# Patient Record
Sex: Female | Born: 1975 | Race: Black or African American | Hispanic: No | Marital: Single | Smoking: Never smoker
Health system: Southern US, Community
[De-identification: ages and names within clinical notes are randomized; demographics above are authoritative.]

## PROBLEM LIST (undated history)

## (undated) DIAGNOSIS — N19 Unspecified kidney failure: Secondary | ICD-10-CM

## (undated) DIAGNOSIS — N049 Nephrotic syndrome with unspecified morphologic changes: Secondary | ICD-10-CM

## (undated) DIAGNOSIS — R569 Unspecified convulsions: Secondary | ICD-10-CM

## (undated) DIAGNOSIS — Z9289 Personal history of other medical treatment: Secondary | ICD-10-CM

## (undated) DIAGNOSIS — D649 Anemia, unspecified: Secondary | ICD-10-CM

## (undated) DIAGNOSIS — E118 Type 2 diabetes mellitus with unspecified complications: Secondary | ICD-10-CM

## (undated) DIAGNOSIS — E1165 Type 2 diabetes mellitus with hyperglycemia: Secondary | ICD-10-CM

## (undated) DIAGNOSIS — I10 Essential (primary) hypertension: Secondary | ICD-10-CM

## (undated) HISTORY — PX: TUBAL LIGATION: SHX77

## (undated) HISTORY — PX: TONSILLECTOMY: SUR1361

---

## 1997-07-24 DIAGNOSIS — IMO0002 Reserved for concepts with insufficient information to code with codable children: Secondary | ICD-10-CM

## 1997-07-24 HISTORY — DX: Reserved for concepts with insufficient information to code with codable children: IMO0002

## 2000-11-11 ENCOUNTER — Encounter: Payer: Self-pay | Admitting: Emergency Medicine

## 2000-11-12 ENCOUNTER — Observation Stay (HOSPITAL_COMMUNITY): Admission: EM | Admit: 2000-11-12 | Discharge: 2000-11-12 | Payer: Self-pay | Admitting: Emergency Medicine

## 2000-11-14 ENCOUNTER — Encounter: Admission: RE | Admit: 2000-11-14 | Discharge: 2000-11-14 | Payer: Self-pay | Admitting: Family Medicine

## 2001-04-05 ENCOUNTER — Emergency Department (HOSPITAL_COMMUNITY): Admission: EM | Admit: 2001-04-05 | Discharge: 2001-04-05 | Payer: Self-pay | Admitting: Emergency Medicine

## 2001-06-12 ENCOUNTER — Emergency Department (HOSPITAL_COMMUNITY): Admission: EM | Admit: 2001-06-12 | Discharge: 2001-06-12 | Payer: Self-pay

## 2012-05-24 DIAGNOSIS — N049 Nephrotic syndrome with unspecified morphologic changes: Secondary | ICD-10-CM

## 2012-05-24 DIAGNOSIS — D649 Anemia, unspecified: Secondary | ICD-10-CM

## 2012-05-24 HISTORY — DX: Anemia, unspecified: D64.9

## 2012-05-24 HISTORY — DX: Nephrotic syndrome with unspecified morphologic changes: N04.9

## 2012-05-30 DIAGNOSIS — Z9289 Personal history of other medical treatment: Secondary | ICD-10-CM

## 2012-05-30 HISTORY — DX: Personal history of other medical treatment: Z92.89

## 2012-09-28 ENCOUNTER — Emergency Department (HOSPITAL_COMMUNITY): Payer: Medicaid Other

## 2012-09-28 ENCOUNTER — Inpatient Hospital Stay (HOSPITAL_COMMUNITY)
Admission: EM | Admit: 2012-09-28 | Discharge: 2012-10-02 | DRG: 698 | Disposition: A | Discharge status: Home / Self Care | Payer: Medicaid Other | Attending: Internal Medicine | Admitting: Internal Medicine

## 2012-09-28 ENCOUNTER — Encounter (HOSPITAL_COMMUNITY): Payer: Self-pay | Admitting: Family Medicine

## 2012-09-28 DIAGNOSIS — N184 Chronic kidney disease, stage 4 (severe): Secondary | ICD-10-CM | POA: Diagnosis present

## 2012-09-28 DIAGNOSIS — T502X5A Adverse effect of carbonic-anhydrase inhibitors, benzothiadiazides and other diuretics, initial encounter: Secondary | ICD-10-CM | POA: Diagnosis not present

## 2012-09-28 DIAGNOSIS — I5033 Acute on chronic diastolic (congestive) heart failure: Secondary | ICD-10-CM | POA: Diagnosis present

## 2012-09-28 DIAGNOSIS — I2699 Other pulmonary embolism without acute cor pulmonale: Secondary | ICD-10-CM | POA: Diagnosis present

## 2012-09-28 DIAGNOSIS — Z6841 Body Mass Index (BMI) 40.0 and over, adult: Secondary | ICD-10-CM

## 2012-09-28 DIAGNOSIS — F411 Generalized anxiety disorder: Secondary | ICD-10-CM | POA: Diagnosis present

## 2012-09-28 DIAGNOSIS — R071 Chest pain on breathing: Secondary | ICD-10-CM | POA: Diagnosis present

## 2012-09-28 DIAGNOSIS — R51 Headache: Secondary | ICD-10-CM | POA: Diagnosis not present

## 2012-09-28 DIAGNOSIS — F410 Panic disorder [episodic paroxysmal anxiety] without agoraphobia: Secondary | ICD-10-CM | POA: Diagnosis present

## 2012-09-28 DIAGNOSIS — Z91199 Patient's noncompliance with other medical treatment and regimen due to unspecified reason: Secondary | ICD-10-CM

## 2012-09-28 DIAGNOSIS — N049 Nephrotic syndrome with unspecified morphologic changes: Secondary | ICD-10-CM | POA: Diagnosis present

## 2012-09-28 DIAGNOSIS — Z794 Long term (current) use of insulin: Secondary | ICD-10-CM

## 2012-09-28 DIAGNOSIS — J189 Pneumonia, unspecified organism: Secondary | ICD-10-CM | POA: Diagnosis present

## 2012-09-28 DIAGNOSIS — E119 Type 2 diabetes mellitus without complications: Secondary | ICD-10-CM | POA: Diagnosis present

## 2012-09-28 DIAGNOSIS — D72829 Elevated white blood cell count, unspecified: Secondary | ICD-10-CM | POA: Diagnosis present

## 2012-09-28 DIAGNOSIS — R0602 Shortness of breath: Secondary | ICD-10-CM | POA: Diagnosis present

## 2012-09-28 DIAGNOSIS — I319 Disease of pericardium, unspecified: Secondary | ICD-10-CM | POA: Diagnosis present

## 2012-09-28 DIAGNOSIS — Z9089 Acquired absence of other organs: Secondary | ICD-10-CM

## 2012-09-28 DIAGNOSIS — D509 Iron deficiency anemia, unspecified: Secondary | ICD-10-CM | POA: Diagnosis present

## 2012-09-28 DIAGNOSIS — I129 Hypertensive chronic kidney disease with stage 1 through stage 4 chronic kidney disease, or unspecified chronic kidney disease: Secondary | ICD-10-CM | POA: Diagnosis present

## 2012-09-28 DIAGNOSIS — E876 Hypokalemia: Secondary | ICD-10-CM | POA: Diagnosis not present

## 2012-09-28 DIAGNOSIS — N179 Acute kidney failure, unspecified: Secondary | ICD-10-CM | POA: Diagnosis present

## 2012-09-28 DIAGNOSIS — Z79899 Other long term (current) drug therapy: Secondary | ICD-10-CM

## 2012-09-28 DIAGNOSIS — I498 Other specified cardiac arrhythmias: Secondary | ICD-10-CM | POA: Diagnosis present

## 2012-09-28 DIAGNOSIS — I509 Heart failure, unspecified: Secondary | ICD-10-CM | POA: Diagnosis present

## 2012-09-28 DIAGNOSIS — I10 Essential (primary) hypertension: Secondary | ICD-10-CM | POA: Diagnosis present

## 2012-09-28 DIAGNOSIS — E1165 Type 2 diabetes mellitus with hyperglycemia: Principal | ICD-10-CM | POA: Diagnosis present

## 2012-09-28 HISTORY — DX: Type 2 diabetes mellitus with unspecified complications: E11.8

## 2012-09-28 HISTORY — DX: Unspecified kidney failure: N19

## 2012-09-28 HISTORY — DX: Personal history of other medical treatment: Z92.89

## 2012-09-28 HISTORY — DX: Anemia, unspecified: D64.9

## 2012-09-28 HISTORY — DX: Type 2 diabetes mellitus with hyperglycemia: E11.65

## 2012-09-28 HISTORY — DX: Essential (primary) hypertension: I10

## 2012-09-28 HISTORY — DX: Nephrotic syndrome with unspecified morphologic changes: N04.9

## 2012-09-28 LAB — CBC WITH DIFFERENTIAL/PLATELET
Basophils Absolute: 0.1 10*3/uL (ref 0.0–0.1)
Basophils Relative: 0 % (ref 0–1)
Eosinophils Absolute: 0.9 10*3/uL — ABNORMAL HIGH (ref 0.0–0.7)
Eosinophils Relative: 5 % (ref 0–5)
HCT: 30.8 % — ABNORMAL LOW (ref 36.0–46.0)
Hemoglobin: 10.6 g/dL — ABNORMAL LOW (ref 12.0–15.0)
Lymphocytes Relative: 8 % — ABNORMAL LOW (ref 12–46)
Lymphs Abs: 1.3 10*3/uL (ref 0.7–4.0)
MCH: 28 pg (ref 26.0–34.0)
MCHC: 34.4 g/dL (ref 30.0–36.0)
MCV: 81.5 fL (ref 78.0–100.0)
Monocytes Absolute: 1.2 10*3/uL — ABNORMAL HIGH (ref 0.1–1.0)
Monocytes Relative: 7 % (ref 3–12)
Neutro Abs: 12.8 10*3/uL — ABNORMAL HIGH (ref 1.7–7.7)
Neutrophils Relative %: 79 % — ABNORMAL HIGH (ref 43–77)
Platelets: 433 10*3/uL — ABNORMAL HIGH (ref 150–400)
RBC: 3.78 MIL/uL — ABNORMAL LOW (ref 3.87–5.11)
RDW: 13.3 % (ref 11.5–15.5)
WBC: 16.2 10*3/uL — ABNORMAL HIGH (ref 4.0–10.5)

## 2012-09-28 LAB — COMPREHENSIVE METABOLIC PANEL
ALT: 19 U/L (ref 0–35)
AST: 21 U/L (ref 0–37)
Alkaline Phosphatase: 100 U/L (ref 39–117)
CO2: 18 mEq/L — ABNORMAL LOW (ref 19–32)
Calcium: 8.5 mg/dL (ref 8.4–10.5)
GFR calc non Af Amer: 36 mL/min — ABNORMAL LOW (ref >90)
Potassium: 3.8 mEq/L (ref 3.5–5.1)
Sodium: 136 mEq/L (ref 135–145)
Total Protein: 5.9 g/dL — ABNORMAL LOW (ref 6.0–8.3)

## 2012-09-28 LAB — POCT I-STAT 3, ART BLOOD GAS (G3+)
Patient temperature: 98.6
pCO2 arterial: 29.5 mmHg — ABNORMAL LOW (ref 35.0–45.0)
pH, Arterial: 7.415 (ref 7.350–7.450)

## 2012-09-28 LAB — BLOOD GAS, ARTERIAL
Acid-base deficit: 5.2 mmol/L — ABNORMAL HIGH (ref 0.0–2.0)
FIO2: 1 %
O2 Saturation: 99.3 %
Patient temperature: 98.6
TCO2: 19.2 mmol/L (ref 0–100)

## 2012-09-28 LAB — POCT I-STAT, CHEM 8
BUN: 25 mg/dL — ABNORMAL HIGH (ref 6–23)
Chloride: 112 mEq/L (ref 96–112)
Creatinine, Ser: 1.7 mg/dL — ABNORMAL HIGH (ref 0.50–1.10)
Glucose, Bld: 287 mg/dL — ABNORMAL HIGH (ref 70–99)
Hemoglobin: 9.5 g/dL — ABNORMAL LOW (ref 12.0–15.0)
Potassium: 4.2 mEq/L (ref 3.5–5.1)
Sodium: 139 mEq/L (ref 135–145)

## 2012-09-28 LAB — MAGNESIUM: Magnesium: 2 mg/dL (ref 1.5–2.5)

## 2012-09-28 LAB — STREP PNEUMONIAE URINARY ANTIGEN: Strep Pneumo Urinary Antigen: NEGATIVE

## 2012-09-28 LAB — RAPID URINE DRUG SCREEN, HOSP PERFORMED
Amphetamines: NOT DETECTED
Opiates: NOT DETECTED

## 2012-09-28 LAB — GLUCOSE, CAPILLARY: Glucose-Capillary: 283 mg/dL — ABNORMAL HIGH (ref 70–99)

## 2012-09-28 LAB — POCT I-STAT TROPONIN I

## 2012-09-28 LAB — PHOSPHORUS: Phosphorus: 3.7 mg/dL (ref 2.3–4.6)

## 2012-09-28 MED ORDER — INSULIN ASPART 100 UNIT/ML ~~LOC~~ SOLN
0.0000 [IU] | Freq: Three times a day (TID) | SUBCUTANEOUS | Status: DC
  Administered 2012-09-30 – 2012-10-02 (×3): 3 [IU] via SUBCUTANEOUS

## 2012-09-28 MED ORDER — LORAZEPAM 2 MG/ML IJ SOLN
0.5000 mg | Freq: Once | INTRAMUSCULAR | Status: AC
  Administered 2012-09-28: 0.5 mg via INTRAVENOUS
  Filled 2012-09-28: qty 1

## 2012-09-28 MED ORDER — LABETALOL HCL 5 MG/ML IV SOLN
5.0000 mg | Freq: Once | INTRAVENOUS | Status: AC
  Administered 2012-09-28: 5 mg via INTRAVENOUS
  Filled 2012-09-28: qty 4

## 2012-09-28 MED ORDER — AZITHROMYCIN 250 MG PO TABS
500.0000 mg | ORAL_TABLET | Freq: Once | ORAL | Status: AC
  Administered 2012-09-28: 500 mg via ORAL
  Filled 2012-09-28: qty 2

## 2012-09-28 MED ORDER — HEPARIN BOLUS VIA INFUSION
5000.0000 [IU] | Freq: Once | INTRAVENOUS | Status: AC
  Administered 2012-09-28: 5000 [IU] via INTRAVENOUS
  Filled 2012-09-28: qty 5000

## 2012-09-28 MED ORDER — ISOSORBIDE MONONITRATE ER 30 MG PO TB24
30.0000 mg | ORAL_TABLET | Freq: Every day | ORAL | Status: DC
  Administered 2012-09-28 – 2012-10-02 (×5): 30 mg via ORAL
  Filled 2012-09-28 (×6): qty 1

## 2012-09-28 MED ORDER — DOXYCYCLINE HYCLATE 100 MG PO TABS
100.0000 mg | ORAL_TABLET | Freq: Two times a day (BID) | ORAL | Status: DC
  Filled 2012-09-28: qty 1

## 2012-09-28 MED ORDER — SODIUM CHLORIDE 0.9 % IV SOLN: 250.0000 mL | INTRAVENOUS | Status: DC | PRN

## 2012-09-28 MED ORDER — DEXTROSE 5 % IV SOLN
1.0000 g | INTRAVENOUS | Status: DC
  Administered 2012-09-29 – 2012-09-30 (×2): 1 g via INTRAVENOUS
  Filled 2012-09-28 (×2): qty 10

## 2012-09-28 MED ORDER — FUROSEMIDE 10 MG/ML IJ SOLN
20.0000 mg | Freq: Once | INTRAMUSCULAR | Status: AC
  Administered 2012-09-28: 20 mg via INTRAVENOUS
  Filled 2012-09-28: qty 2

## 2012-09-28 MED ORDER — HEPARIN (PORCINE) IN NACL 100-0.45 UNIT/ML-% IJ SOLN
1450.0000 [IU]/h | INTRAMUSCULAR | Status: DC
  Administered 2012-09-28 – 2012-09-29 (×2): 1450 [IU]/h via INTRAVENOUS
  Filled 2012-09-28 (×3): qty 250

## 2012-09-28 MED ORDER — FUROSEMIDE 10 MG/ML IJ SOLN
40.0000 mg | Freq: Once | INTRAMUSCULAR | Status: AC
  Administered 2012-09-28: 40 mg via INTRAVENOUS
  Filled 2012-09-28: qty 4

## 2012-09-28 MED ORDER — FUROSEMIDE 10 MG/ML IJ SOLN
40.0000 mg | Freq: Two times a day (BID) | INTRAMUSCULAR | Status: DC
  Administered 2012-09-28 – 2012-09-29 (×2): 40 mg via INTRAVENOUS
  Filled 2012-09-28 (×4): qty 4

## 2012-09-28 MED ORDER — HEPARIN SODIUM (PORCINE) 5000 UNIT/ML IJ SOLN
5000.0000 [IU] | Freq: Three times a day (TID) | INTRAMUSCULAR | Status: DC
  Filled 2012-09-28 (×2): qty 1

## 2012-09-28 MED ORDER — SODIUM CHLORIDE 0.9 % IJ SOLN
3.0000 mL | Freq: Two times a day (BID) | INTRAMUSCULAR | Status: DC
  Administered 2012-09-29 – 2012-10-02 (×7): 3 mL via INTRAVENOUS

## 2012-09-28 MED ORDER — DEXTROSE 5 % IV SOLN
1.0000 g | Freq: Once | INTRAVENOUS | Status: AC
  Administered 2012-09-28: 1 g via INTRAVENOUS
  Filled 2012-09-28: qty 10

## 2012-09-28 MED ORDER — SODIUM CHLORIDE 0.9 % IJ SOLN: 3.0000 mL | INTRAMUSCULAR | Status: DC | PRN

## 2012-09-28 MED ORDER — ALBUTEROL SULFATE (5 MG/ML) 0.5% IN NEBU
5.0000 mg | INHALATION_SOLUTION | Freq: Once | RESPIRATORY_TRACT | Status: AC
  Administered 2012-09-28: 5 mg via RESPIRATORY_TRACT
  Filled 2012-09-28: qty 1

## 2012-09-28 MED ORDER — AZITHROMYCIN 250 MG PO TABS
250.0000 mg | ORAL_TABLET | Freq: Every day | ORAL | Status: DC
  Administered 2012-09-29 – 2012-09-30 (×2): 250 mg via ORAL
  Filled 2012-09-28 (×2): qty 1

## 2012-09-28 MED ORDER — LEVALBUTEROL HCL 0.63 MG/3ML IN NEBU
0.6300 mg | INHALATION_SOLUTION | Freq: Four times a day (QID) | RESPIRATORY_TRACT | Status: DC | PRN
  Filled 2012-09-28: qty 3

## 2012-09-28 MED ORDER — AMLODIPINE BESYLATE 5 MG PO TABS
5.0000 mg | ORAL_TABLET | Freq: Every day | ORAL | Status: DC
  Administered 2012-09-28: 5 mg via ORAL
  Filled 2012-09-28 (×2): qty 1

## 2012-09-28 MED ORDER — METOLAZONE 2.5 MG PO TABS
2.5000 mg | ORAL_TABLET | Freq: Every day | ORAL | Status: DC
  Administered 2012-09-28 – 2012-09-29 (×2): 2.5 mg via ORAL
  Filled 2012-09-28 (×2): qty 1

## 2012-09-28 MED ORDER — INSULIN GLARGINE 100 UNIT/ML ~~LOC~~ SOLN
30.0000 [IU] | Freq: Every day | SUBCUTANEOUS | Status: DC
  Administered 2012-09-28 – 2012-10-01 (×4): 30 [IU] via SUBCUTANEOUS

## 2012-09-28 MED ORDER — NITROGLYCERIN 0.4 MG SL SUBL
0.4000 mg | SUBLINGUAL_TABLET | Freq: Once | SUBLINGUAL | Status: AC
  Administered 2012-09-28: 0.4 mg via SUBLINGUAL
  Filled 2012-09-28: qty 25

## 2012-09-28 MED ORDER — INSULIN ASPART 100 UNIT/ML ~~LOC~~ SOLN
10.0000 [IU] | Freq: Once | SUBCUTANEOUS | Status: AC
  Administered 2012-09-28: 10 [IU] via SUBCUTANEOUS
  Filled 2012-09-28: qty 1

## 2012-09-28 NOTE — ED Notes (Signed)
Pt c/o "splitting" headache rates pain 8/10.

## 2012-09-28 NOTE — ED Notes (Signed)
Pt presents from home with SOB that began at 0400 today and chest pressure x2 days. Pt was recently diagnosed with CHF and stage 1 renal failure. Pt is also diabetic, anemic, and hypertensive. Per Pt her CBG is typically in the 300's, EMS CBG was 266. Per Pt, her BP is typically 180/90-100. EMS BP of 178/118. Pt SOB on arrival. EMS gave 324 ASA and 1SL nitro PTA with no relief of Chest pressure.

## 2012-09-28 NOTE — ED Provider Notes (Signed)
History     CSN: [id number]  Arrival date & time 09/28/12  4098   First MD Initiated Contact with Patient 09/28/12 [phone]      Chief Complaint  Patient presents with  . Chest Pain  . Shortness of Breath    (Consider location/radiation/quality/duration/timing/severity/associated sxs/prior treatment) Patient is a 37 y.o. female presenting with chest pain and shortness of breath. The history is provided by the patient.  Chest Pain Associated symptoms: cough, fatigue and shortness of breath   Associated symptoms: no abdominal pain, no back pain, no headache, no nausea, no numbness, not vomiting and no weakness   Shortness of Breath Associated symptoms: chest pain and cough   Associated symptoms: no abdominal pain, no headaches, no rash and no vomiting    patient presents with worsening shortness of breath over the last few days. She states she is not able sleep in 5 days because she cannot lay down. She's also had some dull right-sided chest pain. Patient has a history of CHF. She states she feels as if she has fluid or lungs. She also has a history of anemia due to heavy periods. She states that she does not know what her hemoglobin normally runs. She has increased swelling or legs. She has had an occasional cough and throw up once after the cough. She states she's had a little bit of sputum,.  Past Medical History  Diagnosis Date  . CHF (congestive heart failure) Nov 2013  . Renal failure     Stage 1  . Hypertension   . Diabetes mellitus without complication 1999  . Anemia Nov 2013    Past Surgical History  Procedure Laterality Date  . Cesarean section      x2  . Tonsillectomy    . Tubal ligation      Family History  Problem Relation Age of Onset  . Epilepsy Father   . Transient ischemic attack Mother   . Cancer - Other Mother     brain  . Hypertension Brother     History  Substance Use Topics  . Smoking status: Never Smoker   . Smokeless tobacco: Never Used  .  Alcohol Use: No    OB History   Grav Para Term Preterm Abortions TAB SAB Ect Mult Living                  Review of Systems  Constitutional: Positive for fatigue. Negative for activity change and appetite change.  HENT: Negative for neck stiffness.   Eyes: Negative for pain.  Respiratory: Positive for cough and shortness of breath. Negative for chest tightness.   Cardiovascular: Positive for chest pain and leg swelling.  Gastrointestinal: Negative for nausea, vomiting, abdominal pain and diarrhea.  Genitourinary: Negative for flank pain.  Musculoskeletal: Negative for back pain.  Skin: Negative for rash.  Neurological: Negative for weakness, numbness and headaches.  Psychiatric/Behavioral: Negative for behavioral problems.    Allergies  Review of patient's allergies indicates no known allergies.  Home Medications   Current Outpatient Rx  Name  Route  Sig  Dispense  Refill  . amLODipine (NORVASC) 5 MG tablet   Oral   Take 5 mg by mouth daily.         . furosemide (LASIX) 40 MG tablet   Oral   Take 40 mg by mouth daily.         . insulin lispro protamine-insulin lispro (HUMALOG 75/25) (75-25) 100 UNIT/ML SUSP   Subcutaneous   Inject 30 Units  into the skin 2 (two) times daily with a meal.         . isosorbide mononitrate (IMDUR) 30 MG 24 hr tablet   Oral   Take 30 mg by mouth daily.         Marland Kitchen lisinopril (PRINIVIL,ZESTRIL) 10 MG tablet   Oral   Take 10 mg by mouth 2 (two) times daily.         . metolazone (ZAROXOLYN) 2.5 MG tablet   Oral   Take 2.5 mg by mouth daily. Take 30 mins before taking Lasix         . Vitamin D, Ergocalciferol, (DRISDOL) 50000 UNITS CAPS   Oral   Take 50,000 Units by mouth every 7 (seven) days. Takes on Wednesday           BP 170/96  Pulse 94  Temp(Src) 98.4 F (36.9 C)  Resp 20  SpO2 100%  LMP 09/08/2012  Physical Exam  Nursing note and vitals reviewed. Constitutional: She is oriented to person, place, and time.  She appears well-developed and well-nourished.  Patient is obese.  HENT:  Head: Normocephalic and atraumatic.  Eyes: EOM are normal. Pupils are equal, round, and reactive to light.  Neck: Normal range of motion. Neck supple.  Cardiovascular: Normal rate, regular rhythm and normal heart sounds.   No murmur heard. Pulmonary/Chest: Effort normal. No respiratory distress. She has wheezes. She has no rales.  Wheezes at bilateral bases  Abdominal: Soft. Bowel sounds are normal. She exhibits no distension. There is no tenderness. There is no rebound and no guarding.  Musculoskeletal: Normal range of motion.  Moderate pitting edema to bilateral lower legs.  Neurological: She is alert and oriented to person, place, and time. No cranial nerve deficit.  Skin: Skin is warm and dry.  Psychiatric: She has a normal mood and affect. Her speech is normal.    ED Course  Procedures (including critical care time)  Labs Reviewed  CBC WITH DIFFERENTIAL - Abnormal; Notable for the following:    WBC 16.2 (*)    RBC 3.78 (*)    Hemoglobin 10.6 (*)    HCT 30.8 (*)    Platelets 433 (*)    Neutrophils Relative 79 (*)    Neutro Abs 12.8 (*)    Lymphocytes Relative 8 (*)    Monocytes Absolute 1.2 (*)    Eosinophils Absolute 0.9 (*)    All other components within normal limits  PRO B NATRIURETIC PEPTIDE - Abnormal; Notable for the following:    Pro B Natriuretic peptide (BNP) 6281.0 (*)    All other components within normal limits  GLUCOSE, CAPILLARY - Abnormal; Notable for the following:    Glucose-Capillary 283 (*)    All other components within normal limits  POCT I-STAT, CHEM 8 - Abnormal; Notable for the following:    BUN 25 (*)    Creatinine, Ser 1.70 (*)    Glucose, Bld 287 (*)    Hemoglobin 9.5 (*)    HCT 28.0 (*)    All other components within normal limits  URINE RAPID DRUG SCREEN (HOSP PERFORMED)  COMPREHENSIVE METABOLIC PANEL  TROPONIN I  POCT I-STAT TROPONIN I   Dg Chest 2  View  09/28/2012  *RADIOLOGY REPORT*  Clinical Data: Chest pain and shortness of breath.  CHEST - 2 VIEW  Comparison: Chest x-ray 09/28/2012.  Findings: Lung volumes are slightly low.  Extensive bibasilar opacities (left greater than right), favored to reflect sequelae of aspiration or developing bilateral lower  lobe bronchopneumonia. Probable small bilateral pleural effusions.  Pulmonary venous congestion, without frank pulmonary edema.  Heart size is borderline enlarged.  Upper mediastinal contours are slightly distorted by patient rotation to the left.  IMPRESSION: 1.  Extensive bibasilar air space disease concerning for sequelae of recent aspiration or developing bilateral lower lobe bronchopneumonia. 2.  Probable small bilateral pleural effusions (left greater than right).   Original Report Authenticated By: Trudie Reed, M.D.      1. CAP (community acquired pneumonia)   2. CHF (congestive heart failure)   3. Acute on chronic diastolic CHF (congestive heart failure)   4. Acute on chronic renal failure   5. Diabetes mellitus, type 2   6. Hypertension   7. Shortness of breath      Date: 09/28/2012  Rate: 110  Rhythm: sinus tachycardia  QRS Axis: normal  Intervals: normal  ST/T Wave abnormalities: normal  Conduction Disutrbances:none  Narrative Interpretation:   Old EKG Reviewed: none available    MDM  Patient with shortness of breath. Likely mixed picture of CHF and pneumonia. White count is elevated as is BNP. Patient was given Lasix. No nitroglycerin at this time due 2 moderate blood pressure. She was also given antibiotics. Her primary care doctor is in [facility] and she'll be admitted to the internal medicine teaching service         Juliet Rude. Rubin Payor, MD 09/28/12 1515

## 2012-09-28 NOTE — Progress Notes (Addendum)
Internal Medicine Interim Progress Note  Called by RN with patient complaints of persistent worsening chest pressure and headache.  Subjective: Patient reports substernal chest pressure that radiates to the right. She continues to feel anxious and cries during interview. She cannot tell if pain is worse with deep inspiration.  Of note, her soon to be step father and mother are at bedside and report history of frequent panic attacks, during which they have to tell her to calm down and take deep breaths in.  Objective: Filed Vitals:     BP: 204/91  Pulse: 110  Temp:   Resp: 22  Saturating 100% on RA  EKG: tachycardia without significant ST changes  General: resting in bed, anxious appearing HEENT: PERRL, EOMI, no scleral icterus Cardiac: tachycardic, no rubs, murmurs or gallops Pulm: clear to auscultation bilaterally, moving normal volumes of air, denies pain on deep inspiration during exam, chest pain not tender to palpation. Abd: soft, nontender, nondistended, BS present Ext: warm and well perfused, +b/l 2+ pedal edema Neuro: alert and oriented X3, cranial nerves II-XII grossly intact  After patient given labetalol (5mg ) and ativan (0.5mg ) and we helped calm her, her pressure came down to 170/96 and chest pressure resolved.  Assessment/Plan Patient likely having a panic attack which is exacerbating her HR and BP, as she is overwhelmed by her illness.  Less concerning since symptoms resolved, but will still get stat CMET (to monitor Cr for possible CTA) and troponin, and continue with plan to cycle troponins.  Of note, her step father reports that she recently underwent cardiac cath that showed nonobstructive disease. We will get these records.  She was intermediate prob by heart rate for PE, but again, my suspicion of this is less likely given her history of panic attacks and resolution of symptoms.  We will continue to monitor.  ADDENDUM: We received records from [facility]  that suggest patient is without history of CHF, but rather nephrotic syndrome (DM nephrosclerosis).  This changes the clinical picture and patient is at risk for PE as she is hypercoagulable.  Instead of risking renal function deterioration with CTA chest at this point, we will diurese her tonight, monitor AM renal function and initiate empiric heparin gtt (high suspicion given ABGs, tachycardia, chest pressure and tachypnea)

## 2012-09-28 NOTE — Progress Notes (Signed)
ANTICOAGULATION CONSULT NOTE - Initial Consult  Pharmacy Consult for heparin Indication: r/o PE  No Known Allergies  Patient Measurements: Height: 5\' 8"  (172.7 cm) Weight: 287 lb 14.7 oz (130.6 kg) IBW/kg (Calculated) : 63.9 Adjusted  Weight: 90.6 kg  Vital Signs: Temp: 99.4 F (37.4 C) (03/08 1854) Temp src: Oral (03/08 1854) BP: 162/114 mmHg (03/08 1748) Pulse Rate: 106 (03/08 1854)  Labs:  Recent Labs  09/28/12 0719 09/28/12 0735 09/28/12 1457  HGB 10.6* 9.5*  --   HCT 30.8* 28.0*  --   PLT 433*  --   --   CREATININE  --  1.70* 1.77*  TROPONINI  --   --  <0.30    Estimated Creatinine Clearance: 62.8 ml/min (by C-G formula based on Cr of 1.77).   Medical History: Past Medical History  Diagnosis Date  . CHF (congestive heart failure) Nov 2013  . Renal failure     Stage 1  . Hypertension   . Diabetes mellitus without complication 1999  . Anemia Nov 2013  . History of nuclear stress test 05/30/12    Subtle reversible defect in the anterior septum at the junction of the mid ventricle and apex without corresponding wall motion abnormality, EF 60%  . Nephrotic syndrome 05/2012    Advanced diabetic nephrosclerosis; bx at [facility]    Medications:  Prescriptions prior to admission  Medication Sig Dispense Refill  . amLODipine (NORVASC) 5 MG tablet Take 5 mg by mouth daily.      . furosemide (LASIX) 40 MG tablet Take 40 mg by mouth daily.      . insulin lispro protamine-insulin lispro (HUMALOG 75/25) (75-25) 100 UNIT/ML SUSP Inject 30 Units into the skin 2 (two) times daily with a meal.      . isosorbide mononitrate (IMDUR) 30 MG 24 hr tablet Take 30 mg by mouth daily.      Marland Kitchen lisinopril (PRINIVIL,ZESTRIL) 10 MG tablet Take 10 mg by mouth 2 (two) times daily.      . metolazone (ZAROXOLYN) 2.5 MG tablet Take 2.5 mg by mouth daily. Take 30 mins before taking Lasix      . Vitamin D, Ergocalciferol, (DRISDOL) 50000 UNITS CAPS Take 50,000 Units by mouth every  7 (seven) days. Takes on Wednesday        Assessment: Ms. Daily is a 37 yo F to start heparin per pharmacy protocol for r/o PE.  CTA is being postponed 2nd her renal insufficiency.  She has a hx of CHF, T2DM, HTN and stage 1 CKD.   Goal of Therapy:  Heparin level 0.3-0.7 units/ml Monitor platelets by anticoagulation protocol: Yes   Plan:  Give 5000 units bolus x 1 Start heparin infusion at 1450 units/hr Check anti-Xa level in 6 hours and daily while on heparin Continue to monitor H&H and platelets  Herby Abraham, Pharm.D. [phone] 09/28/2012 7:03 PM

## 2012-09-28 NOTE — ED Notes (Signed)
Pt ambulated to restroom. 

## 2012-09-28 NOTE — H&P (Signed)
Internal Medicine Teaching Service Resident Admission Note Date: 09/28/2012  Patient name: Janice Young Medical record number: [id number] Date of birth: [birth year] Age: 38 y.o. Gender: female PCP: No primary provider on file.  Medical Service:  I have reviewed the note by Leontine Locket MS III and was present during the interview and physical exam.  Please see my separate note for further details and clarifications.   Signed: Yanique Mulvihill R 09/28/2012, 3:56 PM

## 2012-09-28 NOTE — Progress Notes (Signed)
Medical Student Progress Note  Subjective: Janice Young was experiencing increased chest pressure with high BP (up to 200 systolic), so we were called to reevaluate her.  The chest pain radiated to the right.  Janice Young had tachypnea, tachycardia and increased SOB.  She because tearful during the examination and explained her fear of dying since her daughter's father died recently from cardiac causes.  Janice Young has a history of panic attacks.    Additionally, Janice Young stated that she was on coreg and her lasix were recently increased to 40mg  BID (not daily, per pharmacy).  Per the patient's family, her cardiologist thought she might have "a heart blockage" but when the tests were done, no blockages were found.  Just prior to evaluation ativan and labetalol were given.  After discussing Janice Young's fears and encouraging her to take deep breaths, Janice Young stated that her chest pain and pressure were better but not gone.  Objective: Vital signs in last 24 hours: Filed Vitals:   09/28/12 1630 09/28/12 1730 09/28/12 1748 09/28/12 1854  BP: 187/116  162/114   Pulse: 106  116 106  Temp:   99.4 F (37.4 C) 99.4 F (37.4 C)  TempSrc:   Oral Oral  Resp:   22 2  Height:   5\' 8"  (1.727 m) 5\' 8"  (1.727 m)  Weight:   130.681 kg (288 lb 1.6 oz) 130.6 kg (287 lb 14.7 oz)  SpO2: 99% 98% 100% 100%   Weight change:   Intake/Output Summary (Last 24 hours) at 09/28/12 1903 Last data filed at 09/28/12 1703  Gross per 24 hour  Intake      0 ml  Output    650 ml  Net   -650 ml   Physical Exam: General: sitting up in bed; anxious and tearful, taking shallow quick breaths HEENT: PERRL, EOMI, no scleral icterus Cardiac: RRR, no rubs, murmurs or gallops Pulm: bilateral crackles and decreased breath sounds Abd: obese, soft, nontender, nondistended, BS present Ext: warm and well perfused, 2+ pedal edema bilaterally; decreased sensation bilaterally Neuro: alert and oriented X3, cranial nerves II-XII grossly  intact  Lab Results: CBC    Component Value Date/Time   WBC 16.2* 09/28/2012 0719   RBC 3.78* 09/28/2012 0719   HGB 9.5* 09/28/2012 0735   HCT 28.0* 09/28/2012 0735   PLT 433* 09/28/2012 0719   MCV 81.5 09/28/2012 0719   MCH 28.0 09/28/2012 0719   MCHC 34.4 09/28/2012 0719   RDW 13.3 09/28/2012 0719   LYMPHSABS 1.3 09/28/2012 0719   MONOABS 1.2* 09/28/2012 0719   EOSABS 0.9* 09/28/2012 0719   BASOSABS 0.1 09/28/2012 0719    CMP     Component Value Date/Time   NA 136 09/28/2012 1457   K 3.8 09/28/2012 1457   CL 106 09/28/2012 1457   CO2 18* 09/28/2012 1457   GLUCOSE 259* 09/28/2012 1457   BUN 23 09/28/2012 1457   CREATININE 1.77* 09/28/2012 1457   CALCIUM 8.5 09/28/2012 1457   PROT 5.9* 09/28/2012 1457   ALBUMIN 1.4* 09/28/2012 1457   AST 21 09/28/2012 1457   ALT 19 09/28/2012 1457   ALKPHOS 100 09/28/2012 1457   BILITOT 0.1* 09/28/2012 1457   GFRNONAA 36* 09/28/2012 1457   GFRAA 42* 09/28/2012 1457   ABG -on RA    Component Value Date/Time   PHART 7.415 09/28/2012 1805   PCO2ART 29.5 09/28/2012 1805   PO2ART 32.0* 09/28/2012 1805   HCO3 18.9* 09/28/2012 1805   TCO2 20 09/28/2012 1805  ACIDBASEDEF 5.0* 09/28/2012 1805   O2SAT 95.0 09/28/2012 1805    ABG - after 100% FiO2    Component Value Date/Time   PHART 7.429 09/28/2012 1805   PCO2ART 28.2* 09/28/2012 1805   PO2ART 300.0* 09/28/2012 1805   HCO3 18.4* 09/28/2012 1805   TCO2 19.2 09/28/2012 1805   ACIDBASEDEF 5.2* 09/28/2012 1805   O2SAT 99.3 09/28/2012 1805    Micro Results: No results found for this or any previous visit (from the past 240 hour(s)). Studies/Results: Dg Chest 2 View  09/28/2012  *RADIOLOGY REPORT*  Clinical Data: Chest pain and shortness of breath.  CHEST - 2 VIEW  Comparison: Chest x-ray 09/28/2012.  Findings: Lung volumes are slightly low.  Extensive bibasilar opacities (left greater than right), favored to reflect sequelae of aspiration or developing bilateral lower lobe bronchopneumonia. Probable small bilateral pleural effusions.  Pulmonary venous congestion,  without frank pulmonary edema.  Heart size is borderline enlarged.  Upper mediastinal contours are slightly distorted by patient rotation to the left.  IMPRESSION: 1.  Extensive bibasilar air space disease concerning for sequelae of recent aspiration or developing bilateral lower lobe bronchopneumonia. 2.  Probable small bilateral pleural effusions (left greater than right).   Original Report Authenticated By: Trudie Reed, M.D.    Medications: I have reviewed the patient's current medications. Scheduled Meds: . amLODipine  5 mg Oral Daily  . [START ON 09/29/2012] azithromycin  250 mg Oral Daily  . [START ON 09/29/2012] cefTRIAXone (ROCEPHIN)  IV  1 g Intravenous Q24H  . furosemide  40 mg Intravenous BID  . heparin  5,000 Units Intravenous Once  . insulin aspart  0-20 Units Subcutaneous TID WC  . insulin glargine  30 Units Subcutaneous QHS  . isosorbide mononitrate  30 mg Oral Daily  . metolazone  2.5 mg Oral Daily  . sodium chloride  3 mL Intravenous Q12H   Continuous Infusions:  PRN Meds:. Assessment/Plan:  Janice Young is a 37yo female with hx CHF, T2DM, and HTN that presents to the ED complaining of SOB for the past 5 days and a 2 day history of chest pressure that is currently stable.   Principal Problem:  Shortness of breath  DDX: CHF exacerbation, CAP, MI, PE  Janice Young endorses an 8lb weight gain over the past 3 days and increasing bilateral LLE and running out of her medications last week. Her proBNP is elevated at 6281 and troponin 0.04. She has been afebrile without sick contacts; however there is leukocytosis. Chest xray showed both pulmonary venous congestion and bilateral lobe bronchopneumonia.  OSH records indicate her hospitalization in Nov 2013 showed an EF of 60-65%.  Her admission to OSH in November 2013 was very similar to this presentation with LE swelling, elevated proBNP, leukocytosis and SOB, but chest xray did not show pulmonary edema. 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 With history of panic attacks, increased chest pressure, tachycardia and tachypnea could be due to anxiety on top of the underlying problem, and patient's HR and BP decreased with ativan and deep breathing.    - telemetry monitoring - check mag & phos given prolonged QT - repeat EKG in AM  CHF workup: - repeat cardiac echo since Nov 2013: EF 60-65% at OSH - continue to follow troponins x 3 (1x neg in ED) - change to IV lasix 40 BID (home dose 40mg  oral BID per patient but 40mg  oral daily per pharmacy).  -continue home isosorbide mononitrate  -  strict I/O  -daily weights  -start labetalol since pt states she was on coreg at home  PE workup: -ABG shows pO2 72.0 and pCO2 29.5; which makes shunt or V/Q mismatch likely -place pt on 100% O2 and repeat ABG to see if pO2 corrects (V/Q mismatch more likely) vs. Not correcting (shunt --> PE). -start heparin subcutaneous since nephrotic syndrome has increased risk of hypercoag and PE is possible   -consider CTA chest tomorrow after rechecking AM creatinine or V/Q scan once consolidation/pulmonary edema resolves - LE venous duplex to r/o DVT  CAP workup: -continue azithromycin and ceftriaxone to cover for bronchopneumonia  -levalbuterol nebulizer q6hrs PRN for SOB   Active Problems:  Acute on chronic renal failure: creatinine 1.7 on admission; 1.45 at OSH (Nov 2013), 0.9 prior to Nov 2013 admission.  OSH records indicate nephrotic syndrome - renal consult - 24 hr urine protein collection    - stop lisinopril due to elevated creatinine  Diabetes mellitus, type 2: per OSH records A1c >13.7 in Nov 2013 - check A1c  - SSI + 30 units lantus qHS  Hypertension: BP elevated on admission (150/103-171/99)  -continue home amlodipine, metalzsone  Anemia: per patient required a blood transfusion in Nov 2013; currently on iron supplement  - recheck CBC in AM to r/o  acute on chronic anemia as cause of SOB    LOS: 0 days   This is a Psychologist, occupational Note.  The care of the patient was discussed with Dr. Everardo Beals and the assessment and plan formulated with their assistance.  Please see their attached note for official documentation of the daily encounter.  Esau Grew 09/28/2012, 2:56 PM

## 2012-09-28 NOTE — H&P (Signed)
[facility] Admission Note Date: 09/28/2012  Patient name: Janice Young Medical record number: [id number] Date of birth: 1975-10-06 Age: 37 y.o. Gender: female PCP: No primary provider on file.  Medical Service: Internal Medicine Teaching Service  Attending physician:  Dr. Lars Mage     Chief Complaint: "chest pressure and SOB"  History of Present Illness: Janice Young is a 37yo female with history of CHF, T2DM, HTN and stage 1 renal failure who presents to the ED complaining of "chest pressure" for the past 2 days.  The patient has been feeling SOB for the past 5 days.  She describes that she can't sleep lying flat and gets SOB walking to the bathroom.  The right-sided chest pressure started abruptly 2 days ago while she was lying flat (not sleeping).  She states that her R arm also started "tingling" yesterday.  There has been a 8 lb weight gain over the past 3 days (280-->288 lbs) and worsening bilateral edema.     Janice Young was diagnosed with CHF and anemia during a recent hospitalization in Nov 2013 at [facility] (awaiting records).  At home she takes lasix 40mg , lisinopril, coreg and metolazone.  Last week, she ran out of her meds and was not able to refill her prescription.   She denies fever but has had chills for the past week.  She endorses a sore throat and productive cough, described as clear and frothy but without pink sputum or frank blood.  But, Janice Young has received her flu shot this year, has no known sick contacts and has been ambulating over the past week.    Meds: Lisinopril 10mg  BID Amlodipine 5 mg daily isosorbide mononitrate 30mg  daily Lasix 40mg  daily metolazone 2.5mg  daily Novolin 75-25 30units BID  Allergies: Allergies as of 09/28/2012  . (No Known Allergies)   Past Medical History  Diagnosis Date  . CHF (congestive heart failure) Nov 2013  . Renal failure     Stage 1  . Hypertension   . Diabetes mellitus without complication 1999   . Anemia Nov 2013   Past Surgical History  Procedure Laterality Date  . Cesarean section      x2  . Tonsillectomy    . Tubal ligation     Family History  Problem Relation Age of Onset  . Epilepsy Father    History   Social History  . Marital Status: Single    Spouse Name: N/A    Number of Children: N/A  . Years of Education: N/A   Occupational History  . Not on file.   Social History Main Topics  . Smoking status: Never Smoker   . Smokeless tobacco: Never Used  . Alcohol Use: No  . Drug Use: No  . Sexually Active: No   Other Topics Concern  . Not on file   Social History Narrative  . No narrative on file    Review of Systems: A comprehensive review of systems was negative except for: Constitutional: positive for chills Respiratory: positive for cough, dyspnea on exertion and sputum Cardiovascular: positive for chest pressure/discomfort, dyspnea, lower extremity edema and orthopnea  Physical Exam: Blood pressure 171/99, pulse 109, temperature 98.4 F (36.9 C), resp. rate 20, last menstrual period 09/08/2012, SpO2 100.00%. BP 171/99  Pulse 109  Temp(Src) 98.4 F (36.9 C)  Resp 20  SpO2 100%  LMP 09/08/2012  General: elevated HOB with anxious obese pt SOB while talking HEENT: PERRL, EOMI, no scleral icterus, no JVD Cardiac: RRR,  no rubs, murmurs or gallops Pulm: decreased breath sounds with crackles bilaterally Abd: soft, nontender, nondistended, BS present Ext: warm and well perfused, 3+ bilateral pre-tibial and pedal edema, decreased sensation bilaterally LE Neuro: alert and oriented X3, cranial nerves II-XII grossly intact  Lab results: CBC    Component Value Date/Time   WBC 16.2* 09/28/2012 0719   RBC 3.78* 09/28/2012 0719   HGB 9.5* 09/28/2012 0735   HCT 28.0* 09/28/2012 0735   PLT 433* 09/28/2012 0719   MCV 81.5 09/28/2012 0719   MCH 28.0 09/28/2012 0719   MCHC 34.4 09/28/2012 0719   RDW 13.3 09/28/2012 0719   LYMPHSABS 1.3 09/28/2012 0719   MONOABS 1.2*  09/28/2012 0719   EOSABS 0.9* 09/28/2012 0719   BASOSABS 0.1 09/28/2012 0719   Troponin (Point of Care Test)  Recent Labs  09/28/12 0734  TROPIPOC 0.04   BNP    Component Value Date/Time   PROBNP 6281.0* 09/28/2012 0719   BMET    Component Value Date/Time   NA 139 09/28/2012 0735   K 4.2 09/28/2012 0735   CL 112 09/28/2012 0735   GLUCOSE 287* 09/28/2012 0735   BUN 25* 09/28/2012 0735   CREATININE 1.70* 09/28/2012 0735    ABG    Component Value Date/Time   TCO2 20 09/28/2012 0735    Imaging results:  Dg Chest 2 View  09/28/2012  *RADIOLOGY REPORT*  Clinical Data: Chest pain and shortness of breath.  CHEST - 2 VIEW  Comparison: Chest x-ray 09/28/2012.  Findings: Lung volumes are slightly low.  Extensive bibasilar opacities (left greater than right), favored to reflect sequelae of aspiration or developing bilateral lower lobe bronchopneumonia. Probable small bilateral pleural effusions.  Pulmonary venous congestion, without frank pulmonary edema.  Heart size is borderline enlarged.  Upper mediastinal contours are slightly distorted by patient rotation to the left.  IMPRESSION: 1.  Extensive bibasilar air space disease concerning for sequelae of recent aspiration or developing bilateral lower lobe bronchopneumonia. 2.  Probable small bilateral pleural effusions (left greater than right).   Original Report Authenticated By: Trudie Reed, M.D.     Other results: EKG: nonspecific ST and T waves changes, sinus tachycardia, borderline prolonged QT interval.  Assessment & Plan by Problem: Janice Young is a 37yo female with hx CHF, T2DM, and HTN that presents to the ED complaining of SOB for the past 5 days and a 2 day history of chest pressure that is currently stable.   Principal Problem:   Shortness of breath  DDX: CHF exacerbation, CAP, MI, PE  SOB most likely related to CHF exacerbation.  Janice Young endorses an 8lb weight gain over the past 3 days and increasing bilateral LLE and running out of her  medications last week.  Her proBNP is elevated at 6281 and troponin 0.04.  She has been afebrile without sick contacts; however there is leukocytosis.  Chest xray showed both pulmonary venous congestion and bilateral lobe bronchopneumonia.    - IV lasix 30 BID (3x home dose 40mg  oral daily).     - continue azithromycin and ceftriaxone to cover for bronchopneumonia     -levalbuterol nebulizer q6hrs PRN for SOB    -continue home isosorbide mononitrate    - strict I/O    -daily weights    -consider starting beta-blocker if BP continues to be elevated    Active Problems:   Acute on chronic renal failure: creatinine 1.7 on admission  - stop lisinopril due to elevated creatinine   Diabetes mellitus, type  2   - check A1c  - SSI + 30 units lantus qHS   Hypertension: BP elevated on admission (150/103-171/99)  -continue home amlodipine, metalzsone   Anemia: per patient required a blood transfusion in Nov 2013; currently on iron supplement  - recheck CBC in AM to r/o acute on chronic anemia   This is a Psychologist, occupational Note.  The care of the patient was discussed with Dr. Lavena Bullion and the assessment and plan was formulated with their assistance.  Please see their note for official documentation of the patient encounter.   Signed: Esau Grew 09/28/2012, 11:20 AM

## 2012-09-28 NOTE — H&P (Signed)
Date: 09/28/2012               Patient Name:  Janice Young MRN: [id number]  DOB: [birth year] Age / Sex: 37 y.o., female   PCP: No primary Ariadne Rissmiller on file.              Medical Service: Internal Medicine Teaching Service              Attending Physician: Dr. Lars Mage    First Contact: Dr. Elenor Legato Pager: [phone]  Second Contact: Dr. Stacy Gardner Pager: [phone]            After Hours (After 5p/  First Contact Pager: [phone]  weekends / holidays): Second Contact Pager: [phone]     Chief Complaint: Shortness of breath  History of Present Illness: Patient is a 37 y.o. female with a PMHx of CHF, CKD, T2DM, HTN, who presents to [facility] for evaluation of shortness of breath and chest "pressure" (denies any actual pain). The patient states that she's been having orthopnea for the last 5 days, and that for the last 2 days she's been having worsened shortness of breath also with exertion and at rest sitting upright, although she states it is not as bad as she when she is lying down. She states that for the last 2 days she's also been having right-sided chest pressure that radiates to her right arm.   The patient has a history of CHF since November 2013 and at home is on Lasix 40 mg twice a day, lisinopril, Coreg, and metolazone, all of which she's been taking regularly since that time until approximately one week ago when she was out of all her medications. She tries to weigh herself regularly at home, and admits to a 8 pound weight gain over the last 3 days(280 pounds to 288 pounds). She also admits to increased bilateral lower extremity swelling for approximately one week. She admits to a cough productive of clear, frothy sputum. She denies hemoptysis. She denies any recent fever or chills. She denies any recent sick contacts, or any recent or remote history of pneumonia.  Review of Systems: Per HPI.  Current Outpatient Medications: No current facility-administered medications on file  prior to encounter.   No current outpatient prescriptions on file prior to encounter.    Allergies: No Known Allergies   Past Medical History: Past Medical History  Diagnosis Date  . CHF (congestive heart failure)   . Renal failure     Stage 1  . Hypertension   . Diabetes mellitus without complication     Past Surgical History: Past Surgical History  Procedure Laterality Date  . Cesarean section      x2  . Tonsillectomy    . Tubal ligation      Family History: Family History  Problem Relation Age of Onset  . Epilepsy Father     Social History: History   Social History  . Marital Status: Single    Spouse Name: N/A    Number of Children: N/A  . Years of Education: N/A   Occupational History  . Not on file.   Social History Main Topics  . Smoking status: Never Smoker   . Smokeless tobacco: Never Used  . Alcohol Use: No  . Drug Use: No  . Sexually Active: No   Other Topics Concern  . Not on file   Social History Narrative  . No narrative on file     Vital Signs: Blood pressure 171/99, pulse  109, temperature 98.4 F (36.9 C), resp. rate 20, last menstrual period 09/08/2012, SpO2 100.00%.  Physical Exam: General: Vital signs reviewed and noted. Severe obesity.  Head: Normocephalic, atraumatic.  Eyes: PERRL, EOMI, No signs of anemia or jaundince.  Neck: No deformities, masses, or tenderness noted.Supple, No carotid Bruits, no JVD.  Lungs:  Moderately increased WOB. Decreased breath sounds bilaterally.  Heart: RRR. S1 and S2 normal without gallop, murmur, or rubs.  Abdomen:  BS normoactive. Soft, Nondistended, non-tender.  No masses or organomegaly.  Extremities: 2+ pitting pretibial edema bilaterally.  Neurologic: A&O X3, CN II - XII are grossly intact. Motor strength is 5/5 in the all 4 extremities, Sensations intact to light touch, Cerebellar signs negative.  Skin: No visible rashes, scars.   Lab results: Comprehensive Metabolic Panel:      Component Value Date/Time   NA 139 09/28/2012 0735   K 4.2 09/28/2012 0735   CL 112 09/28/2012 0735   BUN 25* 09/28/2012 0735   CREATININE 1.70* 09/28/2012 0735   GLUCOSE 287* 09/28/2012 0735    CBC:    Component Value Date/Time   WBC 16.2* 09/28/2012 0719   HGB 9.5* 09/28/2012 0735   HCT 28.0* 09/28/2012 0735   PLT 433* 09/28/2012 0719   MCV 81.5 09/28/2012 0719   NEUTROABS 12.8* 09/28/2012 0719   LYMPHSABS 1.3 09/28/2012 0719   MONOABS 1.2* 09/28/2012 0719   EOSABS 0.9* 09/28/2012 0719   BASOSABS 0.1 09/28/2012 0719    Troponin (Point of Care Test)  Recent Labs  09/28/12 0734  TROPIPOC 0.04    Imaging results:  Dg Chest 2 View  09/28/2012  *RADIOLOGY REPORT*  Clinical Data: Chest pain and shortness of breath.  CHEST - 2 VIEW  Comparison: Chest x-ray 09/28/2012.  Findings: Lung volumes are slightly low.  Extensive bibasilar opacities (left greater than right), favored to reflect sequelae of aspiration or developing bilateral lower lobe bronchopneumonia. Probable small bilateral pleural effusions.  Pulmonary venous congestion, without frank pulmonary edema.  Heart size is borderline enlarged.  Upper mediastinal contours are slightly distorted by patient rotation to the left.  IMPRESSION: 1.  Extensive bibasilar air space disease concerning for sequelae of recent aspiration or developing bilateral lower lobe bronchopneumonia. 2.  Probable small bilateral pleural effusions (left greater than right).   Original Report Authenticated By: Trudie Reed, M.D.      Other results:  EKG (09/28/2012) - Normal Sinus Rhythm, regular rate of approximately 110 bpm, normal axis, ST segments: normal.     Assessment & Plan: Patient is a 38 y.o. female with a PMHx of CHF, CKD, T2DM, HTN who was admitted on 09/28/2012 with symptoms of SOB which was determined to be secondary to CHF exacerbation. Interventions at this time will be focused on medical diuresis and related therapies.     CHF exacerbation - proBNP = 6281 and  CXR revealing of vascular congestion. Patient has no previous medical records and [facility], however she states that her CHF was diagnosed in 05/2012 during a hospitalization at [facility]. She has not been taking any of her CHF meds for the last week, including her Lasix and metolazone, which is the likely reason for her current exacerbation. The patient received 40 mg of IV Lasix in the ED. - Admit on telemetry - lasix 30mg  IV bid (effectively 3x home dose of 40mg  PO daily) - Isosorbide ER 30 mg daily - Metolazone 2.5 mg daily before Lasix - troponin x 3 (neg x 1 in ED) -  Obtain medical records from [facility] - Strict I/O's - Daily weights  Leukocytosis - unclear if this can be explained from the patient's CHF exacerbation alone. Although the patient's presentation does not seem consistent with pneumonia, CXR was interpreted as most likely representative of bilateral pneumonia, thus the patient will receive antibiotic therapy at this time. Pt received flu vaccine this year. In the ED she received azithromycin and IV ceftriaxone. -Continue azithromycin -Continue ceftriaxone -Repeat CXR after diuresis => will consider D/C antibiotics if substantially improved and clinically improving - check urine strep, legionella CKD - Cr = 1.70 on admission. Holding lisinopril at this time.  - obtain medical records  Type 2 diabetes mellitus - the patient states that she has a long history of type 2 diabetes, for which she currently takes 30 units of 70/30 insulin twice a day. -SSI -Check A1c  Hypertension - BP moderately elevated in the ED, likely 2/2 acute illness and antiHTN noncompliance. Will restart home meds.  - Norvasc 5 mg daily -Isosorbide ER 30 mg daily -Metolazone 2.5 mg daily before Lasix - lasix 30mg  IV bid (effectively 3x home dose of 40mg  PO daily)    DVT PPX - heparin  CODE STATUS - full  CONSULTS PLACED - N/A  DISPO - Disposition is deferred at this  time, awaiting improvement of current medical problems.   Anticipated discharge in approximately 2-3 day(s).   The patient does have a current PCP (No primary Hazely Sealey on file.) and will not be requiring OPC follow-up after discharge.   The patient does not have transportation limitations that hinder transportation to clinic appointments.   Services Needed at time of discharge: TO BE DETERMINED DURING HOSPITAL COURSE         Y = Yes, Blank = No PT:   OT:   RN:   Equipment:   Other:     Signed: Elfredia Nevins, MD  PGY-1, Internal Medicine Resident Pager: [phone]) 09/28/2012, 11:11 AM

## 2012-09-29 ENCOUNTER — Inpatient Hospital Stay (HOSPITAL_COMMUNITY): Payer: Medicaid Other

## 2012-09-29 ENCOUNTER — Encounter (HOSPITAL_COMMUNITY): Payer: Self-pay | Admitting: Internal Medicine

## 2012-09-29 LAB — CBC WITH DIFFERENTIAL/PLATELET
Basophils Absolute: 0 10*3/uL (ref 0.0–0.1)
Basophils Relative: 0 % (ref 0–1)
Eosinophils Absolute: 1 10*3/uL — ABNORMAL HIGH (ref 0.0–0.7)
Eosinophils Relative: 9 % — ABNORMAL HIGH (ref 0–5)
HCT: 29.9 % — ABNORMAL LOW (ref 36.0–46.0)
Lymphocytes Relative: 16 % (ref 12–46)
MCHC: 33.8 g/dL (ref 30.0–36.0)
MCV: 81.5 fL (ref 78.0–100.0)
Monocytes Absolute: 1 10*3/uL (ref 0.1–1.0)
Platelets: 463 10*3/uL — ABNORMAL HIGH (ref 150–400)
RDW: 13.5 % (ref 11.5–15.5)
WBC: 11.4 10*3/uL — ABNORMAL HIGH (ref 4.0–10.5)

## 2012-09-29 LAB — CBC
HCT: 30.7 % — ABNORMAL LOW (ref 36.0–46.0)
Hemoglobin: 10.4 g/dL — ABNORMAL LOW (ref 12.0–15.0)
MCH: 27.7 pg (ref 26.0–34.0)
MCHC: 33.9 g/dL (ref 30.0–36.0)
RDW: 13.8 % (ref 11.5–15.5)

## 2012-09-29 LAB — URINALYSIS, ROUTINE W REFLEX MICROSCOPIC
Bilirubin Urine: NEGATIVE
Glucose, UA: 100 mg/dL — AB
Ketones, ur: NEGATIVE mg/dL
Specific Gravity, Urine: 1.01 (ref 1.005–1.030)
pH: 7 (ref 5.0–8.0)

## 2012-09-29 LAB — LEGIONELLA ANTIGEN, URINE: Legionella Antigen, Urine: NEGATIVE

## 2012-09-29 LAB — BASIC METABOLIC PANEL
CO2: 18 mEq/L — ABNORMAL LOW (ref 19–32)
Calcium: 8.4 mg/dL (ref 8.4–10.5)
Creatinine, Ser: 1.91 mg/dL — ABNORMAL HIGH (ref 0.50–1.10)
GFR calc Af Amer: 38 mL/min — ABNORMAL LOW (ref >90)
GFR calc non Af Amer: 33 mL/min — ABNORMAL LOW (ref >90)
Sodium: 137 mEq/L (ref 135–145)

## 2012-09-29 LAB — URINE MICROSCOPIC-ADD ON

## 2012-09-29 LAB — HEPARIN LEVEL (UNFRACTIONATED)
Heparin Unfractionated: >2 IU/mL — ABNORMAL HIGH (ref 0.30–0.70)
Heparin Unfractionated: 0.27 IU/mL — ABNORMAL LOW (ref 0.30–0.70)

## 2012-09-29 LAB — GLUCOSE, CAPILLARY
Glucose-Capillary: 165 mg/dL — ABNORMAL HIGH (ref 70–99)
Glucose-Capillary: 117 mg/dL — ABNORMAL HIGH (ref 70–99)

## 2012-09-29 LAB — TROPONIN I: Troponin I: <0.3 ng/mL (ref <0.30)

## 2012-09-29 LAB — HCG, SERUM, QUALITATIVE: Preg, Serum: NEGATIVE

## 2012-09-29 MED ORDER — MORPHINE SULFATE 2 MG/ML IJ SOLN
2.0000 mg | Freq: Once | INTRAMUSCULAR | Status: AC
  Administered 2012-09-29: 2 mg via INTRAVENOUS
  Filled 2012-09-29: qty 1

## 2012-09-29 MED ORDER — POTASSIUM CHLORIDE CRYS ER 20 MEQ PO TBCR
40.0000 meq | EXTENDED_RELEASE_TABLET | Freq: Once | ORAL | Status: AC
  Administered 2012-09-29: 40 meq via ORAL
  Filled 2012-09-29: qty 2

## 2012-09-29 MED ORDER — HYDROCODONE-ACETAMINOPHEN 5-325 MG PO TABS
1.0000 | ORAL_TABLET | Freq: Four times a day (QID) | ORAL | Status: DC | PRN
  Administered 2012-09-29: 1 via ORAL
  Administered 2012-09-30 – 2012-10-01 (×3): 2 via ORAL
  Filled 2012-09-29: qty 1
  Filled 2012-09-29 (×3): qty 2

## 2012-09-29 MED ORDER — LORAZEPAM 2 MG/ML IJ SOLN
1.0000 mg | Freq: Once | INTRAMUSCULAR | Status: AC
  Administered 2012-09-29: 1 mg via INTRAVENOUS
  Filled 2012-09-29: qty 1

## 2012-09-29 MED ORDER — HYDRALAZINE HCL 20 MG/ML IJ SOLN
10.0000 mg | Freq: Once | INTRAMUSCULAR | Status: AC
  Administered 2012-09-29: 10 mg via INTRAVENOUS
  Filled 2012-09-29: qty 0.5

## 2012-09-29 MED ORDER — FUROSEMIDE 10 MG/ML IJ SOLN
80.0000 mg | Freq: Two times a day (BID) | INTRAMUSCULAR | Status: DC
  Administered 2012-09-29 – 2012-10-01 (×4): 80 mg via INTRAVENOUS
  Filled 2012-09-29 (×5): qty 8

## 2012-09-29 MED ORDER — AMLODIPINE BESYLATE 10 MG PO TABS
10.0000 mg | ORAL_TABLET | Freq: Every day | ORAL | Status: DC
  Administered 2012-09-29 – 2012-10-02 (×4): 10 mg via ORAL
  Filled 2012-09-29 (×4): qty 1

## 2012-09-29 MED ORDER — HYDRALAZINE HCL 25 MG PO TABS
25.0000 mg | ORAL_TABLET | Freq: Four times a day (QID) | ORAL | Status: DC
  Administered 2012-09-29 – 2012-09-30 (×2): 25 mg via ORAL
  Filled 2012-09-29 (×6): qty 1

## 2012-09-29 MED ORDER — ACETAMINOPHEN 325 MG PO TABS
650.0000 mg | ORAL_TABLET | Freq: Four times a day (QID) | ORAL | Status: DC | PRN
  Administered 2012-09-29: 650 mg via ORAL
  Filled 2012-09-29: qty 2

## 2012-09-29 MED ORDER — LORAZEPAM 2 MG/ML IJ SOLN: 1.0000 mg | INTRAMUSCULAR | Status: DC | PRN

## 2012-09-29 NOTE — H&P (Signed)
Internal Medicine Attending Admission Note Date: 09/29/2012  Patient name: Janice Young Medical record number: [id number] Date of birth: Apr 18, 1976 Age: 37 y.o. Gender: female  I saw and evaluated the patient. I reviewed the resident's note and I agree with the resident's findings and plan as documented in the resident's note.  Chief Complaint(s): progressive shortness of breath and swelling  History - key components related to admission: Patient is a 37 y.o. female with a PMHx of poorly controlled diabetes, hypertension, and iron-deficiency anemia who was admitted to [facility] on 09/28/2012 for evaluation of shortness of breath. Patients breathing has been progressively getting worse over last 5-7 days. The shortness of breath has progressed over last 5 days and patient now complains of orthopnea. She also complained of increased swelling in extremities and says that she has gained over 8 pounds as compared to her baseline weight. She is compliant with all her medications at this time. Patient also complains of right sided chest pain that radiates to her right arm. Pain was described as 8/10, pressure like sensation which has no exacerbating and relieving factors at this time.  Patient also complaining of headache which is 7/10 present in the frontal region, persistent, no radiation, not associated with nausea and vomiting.   15 point ROS is negative except what is noted above.  Past medical history, past surgical history, medications, social history and family history as per resident's note.  Physical Exam - key components related to admission:  Filed Vitals:   09/29/12 0050 09/29/12 0523 09/29/12 0949 09/29/12 1700  BP: 202/104 194/92 164/104 201/99  Pulse: 108 113 109 108  Temp:  98.3 F (36.8 C)  97.9 F (36.6 C)  TempSrc:  Oral  Axillary  Resp:  22  22  Height:      Weight:  285 lb 11.5 oz (129.6 kg)    SpO2:  100%  100%  Physical Exam: General: Vital signs reviewed and noted.  Well-developed, well-nourished, in some acute distress due to ongoing headache; alert, appropriate and cooperative throughout examination.  Head: Normocephalic, atraumatic.  Eyes: PERRL, EOMI, No signs of anemia or jaundince.  Nose: Mucous membranes moist, not inflammed, nonerythematous.  Throat: Oropharynx nonerythematous, no exudate appreciated.   Neck: No deformities, masses, or tenderness noted.Supple, No carotid Bruits, no JVD.  Lungs:  Normal respiratory effort. Decreased breath sounds at bases bilaterally  Heart: RRR. S1 and S2 normal without gallop, murmur, or rubs.  Abdomen:  BS normoactive. Soft, Nondistended, non-tender.  No masses or organomegaly.  Extremities: 2+ pitting edema upto knees  Neurologic: A&O X3, CN II - XII are grossly intact. Motor strength is 5/5 in the all 4 extremities, Sensations intact to light touch, Cerebellar signs negative.  Skin: Patient has multiple skin tags on his face and neck   Lab results:  Basic Metabolic Panel:  Recent Labs  56/21/30 1457 09/28/12 1833 09/29/12 0655  NA 136  --  137  K 3.8  --  3.4*  CL 106  --  106  CO2 18*  --  18*  GLUCOSE 259*  --  116*  BUN 23  --  24*  CREATININE 1.77*  --  1.91*  CALCIUM 8.5  --  8.4  MG  --  2.0  --   PHOS  --  3.7  --    Liver Function Tests:  Recent Labs  09/28/12 1457  AST 21  ALT 19  ALKPHOS 100  BILITOT 0.1*  PROT 5.9*  ALBUMIN 1.4*  CBC:  Recent Labs  09/28/12 0719 09/28/12 0735 09/29/12 0655  WBC 16.2*  --  11.4*  NEUTROABS 12.8*  --  7.6  HGB 10.6* 9.5* 10.1*  HCT 30.8* 28.0* 29.9*  MCV 81.5  --  81.5  PLT 433*  --  463*   Cardiac Enzymes:  Recent Labs  09/28/12 1833 09/29/12 0014 09/29/12 0655  TROPONINI <0.30 <0.30 <0.30   CBG:  Recent Labs  09/28/12 1246 09/28/12 1755 09/28/12 2103 09/29/12 0555 09/29/12 1113 09/29/12 1702  GLUCAP 283* 109* 165* 112* 117* 116*   Misc. Labs: Pro-bnp of 6281  Urien analysis:  Color, Urine YELLOW YELLOW  Final APPearance TURBID (A) CLEAR Final Specific Gravity, Urine 1.010 1.005 - 1.030 Final pH 7.0 5.0 - 8.0 Final Glucose, UA 100 (A) NEGATIVE mg/dL Final Hgb urine dipstick LARGE (A) NEGATIVE Final Bilirubin Urine NEGATIVE NEGATIVE Final Ketones, ur NEGATIVE NEGATIVE mg/dL Final Protein, ur >161 (A) NEGATIVE mg/dL Final Urobilinogen, UA 0.2 0.0 - 1.0 mg/dL Final Nitrite NEGATIVE NEGATIVE Final Leukocytes, UA NEGATIVE NEGATIVE Final  Imaging results:  Dg Chest 2 View  09/28/2012  *RADIOLOGY REPORT*  Clinical Data: Chest pain and shortness of breath.  CHEST - 2 VIEW  Comparison: Chest x-ray 09/28/2012.  Findings: Lung volumes are slightly low.  Extensive bibasilar opacities (left greater than right), favored to reflect sequelae of aspiration or developing bilateral lower lobe bronchopneumonia. Probable small bilateral pleural effusions.  Pulmonary venous congestion, without frank pulmonary edema.  Heart size is borderline enlarged.  Upper mediastinal contours are slightly distorted by patient rotation to the left.  IMPRESSION: 1.  Extensive bibasilar air space disease concerning for sequelae of recent aspiration or developing bilateral lower lobe bronchopneumonia. 2.  Probable small bilateral pleural effusions (left greater than right).   Original Report Authenticated By: Trudie Reed, M.D.    Ct Head Wo Contrast  09/29/2012  *RADIOLOGY REPORT*  Clinical Data: Rule out hemorrhagic stroke.  Migraine symptoms.  CT HEAD WITHOUT CONTRAST  Technique:  Contiguous axial images were obtained from the base of the skull through the vertex without contrast.  Comparison: None  Findings: Ventricle size is normal.  Negative for acute or chronic infarct.  Negative for intracranial hemorrhage or mass lesion. Calvarium intact.  IMPRESSION: Negative   Original Report Authenticated By: Janeece Riggers, M.D.     Other results: EKG: 110 BPM, normal axis, T wave flattening in I, aVL , no other ST and T wave changes  Assessment &  Plan by Problem:  Principal Problem:   Shortness of breath Active Problems:   Acute on chronic diastolic CHF (congestive heart failure)   Acute on chronic renal failure   CAP (community acquired pneumonia)   Diabetes mellitus, type 2   Hypertension   Nephrotic syndrome  Patient is a 37 year old female with PMH most significant for insulin dependent diabetes since age 80 which is uncontrolled, HTN, recently diagnosed nephrotic syndrome who comes in with progressive shortness of breath, worsening lower extremity edema and chest pain. Patient's condition is further complicated by worsening renal failure(crt worsening from 0.9 in Nov-->1.4 end of Nov-->1.7 on admission-->1.9 today). I believe that patients's worsening renal function and on going protienuria and hence hypoalbuminemia is contributing to her anasarca at this time.  1. Shortness of breath: Likely 2/2 volume overload from hypoalbuminemia and worsening renal function. The CXR suggests community acquired PNA(nephrotic syndrome is a risk factor for infections) which may be making the breathing worse at this time. Given that patient is  at risk for VTE due to nephrotic syndrome, PE is a likely differential. -treat for CAP with ceftriaxone and azithromycin -blood cultures, urine for legionella and strep -diuresis with lasix -strict i's and o's -Start heparin drip for presumed PE -check bilateral doppler to rule out DVT, (unable to obtain CTA due to worse renal function and VQ scan as poor quality CXR) -Plan to DC heparin drip if doppler scan is negative  2. Insulin dependent Diabetes: SSI and repeat Hba1c  3. Acute on chronic renal failure: Patient with recently diagnosed nephrotic syndrome puts her on increased risk of infections, thromboembolisms, infections and acute kidney injury. This may likely suggest complication of nephrotic syndrome. -nephrology consult for evaluation and management of worsening renal function in the setting of  recently diagnosed nephrotic syndrome -continue ACE inhibitor -continue to monitor daily renal function  4. Chest pain: Differentials include community acquired PNA (pleuritic), PE, ACS -Troponins negative x 3 -On heparin for PE -Pain control -treatment of CAP  5. HTN: Rstart home medications.  Rest of the management as per residents.  Lars Mage MD Faculty-Internal Medicine Residency Program Pager [phone]

## 2012-09-29 NOTE — Progress Notes (Signed)
ANTICOAGULATION CONSULT NOTE - Initial Consult  Pharmacy Consult for heparin Indication: r/o PE  No Known Allergies  Patient Measurements: Height: 5\' 8"  (172.7 cm) Weight: 285 lb 11.5 oz (129.6 kg) IBW/kg (Calculated) : 63.9 Adjusted  Weight: 90.6 kg  Vital Signs: Temp: 98.3 F (36.8 C) (03/09 0523) Temp src: Oral (03/09 0523) BP: 194/92 mmHg (03/09 0523) Pulse Rate: 113 (03/09 0523)  Labs:  Recent Labs  09/28/12 0719 09/28/12 0735  09/28/12 1457 09/28/12 1833 09/29/12 0014 09/29/12 0655 09/29/12 0829  HGB 10.6* 9.5*  --   --   --   --  10.1*  --   HCT 30.8* 28.0*  --   --   --   --  29.9*  --   PLT 433*  --   --   --   --   --  463*  --   HEPARINUNFRC  --   --   --   --   --   --  >2.00* 0.39  CREATININE  --  1.70*  --  1.77*  --   --  1.91*  --   TROPONINI  --   --   < > <0.30 <0.30 <0.30 <0.30  --   < > = values in this interval not displayed.  Estimated Creatinine Clearance: 58 ml/min (by C-G formula based on Cr of 1.91).   Medical History: Past Medical History  Diagnosis Date  . Renal failure     Stage 1  . Hypertension   . Diabetes mellitus without complication 1999  . Anemia Nov 2013  . History of nuclear stress test 05/30/12    Subtle reversible defect in the anterior septum at the junction of the mid ventricle and apex without corresponding wall motion abnormality, EF 60%  . Nephrotic syndrome 05/2012    Advanced diabetic nephrosclerosis; bx at [facility]    Medications:  Prescriptions prior to admission  Medication Sig Dispense Refill  . amLODipine (NORVASC) 5 MG tablet Take 5 mg by mouth daily.      . furosemide (LASIX) 40 MG tablet Take 40 mg by mouth daily.      . insulin lispro protamine-insulin lispro (HUMALOG 75/25) (75-25) 100 UNIT/ML SUSP Inject 30 Units into the skin 2 (two) times daily with a meal.      . isosorbide mononitrate (IMDUR) 30 MG 24 hr tablet Take 30 mg by mouth daily.      Marland Kitchen lisinopril (PRINIVIL,ZESTRIL) 10 MG  tablet Take 10 mg by mouth 2 (two) times daily.      . metolazone (ZAROXOLYN) 2.5 MG tablet Take 2.5 mg by mouth daily. Take 30 mins before taking Lasix      . Vitamin D, Ergocalciferol, (DRISDOL) 50000 UNITS CAPS Take 50,000 Units by mouth every 7 (seven) days. Takes on Wednesday      . carvedilol (COREG) 25 MG tablet Take 25 mg by mouth 2 (two) times daily with a meal.      . ondansetron (ZOFRAN) 4 MG tablet Take 4 mg by mouth every 8 (eight) hours as needed for nausea.      . pravastatin (PRAVACHOL) 10 MG tablet Take 10 mg by mouth daily.        Assessment: Ms. Markov is a 37 yo F on heparin per pharmacy protocol for r/o PE.  CTA is being postponed 2nd her renal insufficiency.  She has a hx of CHF, T2DM, HTN and stage 1 CKD. First Heparin level came back >2.0, this is believed  to be misdrawn or lab error. Stat heparin level was drawn immediately afterwards and was normal. No bleeding noted, CBC normal   Goal of Therapy:  Heparin level 0.3-0.7 units/ml Monitor platelets by anticoagulation protocol: Yes   Plan:  Continue heparin drip at 1450 units/hr Monitor daily PT/INR  And CBC   Vania Rea. Darin Engels.D. Clinical Pharmacist Pager [phone] Phone [phone] 09/29/2012 9:24 AM

## 2012-09-29 NOTE — Progress Notes (Deleted)
Resident Addendum to Medical Student Note   I have seen and examined the patient, and agree with the the medical student assessment and plan outlined above. Please see my brief note below for additional details.  S: Janice Young was understandably very anxious this morning regarding her multiple serious medical conditions. Her blood pressure became highly elevated with systolic as high as . She also complained of a 10/10 bitemporal headache. Due to concern for hypertensive emergency, CT head was performed which was negative for hemorrhage. She received 1 mg Ativan IV, with significant improvement in her anxiety.    OBJECTIVE: VS: Reviewed  Meds: Reviewed  Labs: Reviewed  Imaging: Reviewed   Physical Exam: General: Vital signs reviewed and noted. Severe obesity.  HEENT: Normocephalic, atraumatic  Lungs:  Normal respiratory effort. Decreased BS bilaterally.  Heart: RRR. S1 and S2 normal without gallop, murmur, or rubs.  Abdomen:  BS normoactive. Soft, Nondistended, non-tender.    Extremities: Neuro: 2+ pitting pretibial edema bilaterally. AAOx3. CN II-XII intact.      ASSESSMENT/ PLAN: Patient is a 37 y.o. female with a PMHx of CHF, CKD, T2DM, HTN who was admitted on 09/28/2012 with symptoms of SOB which was determined to be secondary PE (vs CHF).   SOB/PE - proBNP = 6281 and CXR revealing of vascular congestion. Records from [facility] from a hospitalization in 05/2012 revealed EF = 60-65%. Although on admission there was significant concern for CHF exacerbation given pt's reports of a CHF history, concern has now turned more towards pulmonary embolism as most likely etiology of the patient's presentation. PE would seem to be the most cohesive explanation for the patient's elevated proBNP, leukocytosis, relatively sudden onset of shortness of breath, and unusual CXR findings, particularly with knowledge of a normal 2d echo in 05/2012. Pt also has a history of nephrotic syndrome,  greatly increasing her risk of developing VTE. Unfortunately the patient's renal function is worsening, thus CTA cannot be safely pursued. V/Q would not be diagnostic in the setting of patient's underlying CXR abnormalities.  - cont heparin per pharm for presumed PE - bilateral LE doppler u/s - cont azithro/ceftriaxone due to CXR findings concerning for PNA (although no clinical evidence to support PNA, thus may consider d/c abx if pt remains afebrile)  CHF - pt endorses history of CHF and home medications are consistent with this, although pt has normal EF in 05/2012, and despite diuresing a net -1.7L since admission, her creatinine is rising (1.7=>1.91). 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 Cr = 1.70 on admission, increased to 1.91 this AM. Likely 2/2 lasix. - consult nephrology  Nephrotic syndrome - records from [facility] indicated pt has nephrotic syndrome. Spot urine protein from this admission reveal 1000mg /dL on one occurrence, however no 24hr collection performed. Albumin = 1.4. - 24hr urine protein  Type 2 diabetes mellitus - CBGs well-controlled on SSI and lantus 30U qhs.    Hypertension - BP significantly elevated this AM (202/104). Pt complaining of 10/10 headache, however CT head negative for hemorrhage. - cont lasix - cont metolazone - start norvasc 10mg   Anxiety - this appears to be a significant issue for this patient, although she does not take any psychiatric medications  at home. The severity of her anxiety seems unlikely to be related solely to her acute medical conditions, however it is likely significantly worsened from baseline.  - ativan PRN  DVT PPX - heparin  CODE STATUS - full  CONSULTS PLACED -  Nephrology DISPO - Disposition is deferred at this time, awaiting improvement of current medical problems.  Anticipated discharge in approximately 2-3 day(s).  The patient does have a current PCP (No primary provider on file.) and will not be requiring OPC follow-up after discharge.  The patient does not have transportation limitations that hinder transportation to clinic appointments.   Length of Stay: 1   McTyre, Emory R  09/29/2012, 11:02 AM

## 2012-09-29 NOTE — Progress Notes (Signed)
Large callus right foot on heel, dark in color.  Left foot shows, small purple area to left heel, callus at right of great toes.  Heel are not boggy.

## 2012-09-29 NOTE — Progress Notes (Signed)
Pt placed on venti mask per RN request (pt was refusing nasal cannula)

## 2012-09-29 NOTE — Progress Notes (Signed)
I have seen and examined the patient, and agree with the the medical student assessment and plan outlined above. Please see my brief note below for additional details.   S:  Janice Young was understandably very anxious this morning regarding her multiple serious medical conditions. Her blood pressure became elevated to as high as 202/172mmHg. She also complained of a 10/10 bitemporal headache. Due to concern for hypertensive emergency, CT head was performed which was negative for hemorrhage. She received 1 mg Ativan IV, with significant improvement in her anxiety.   OBJECTIVE:  VS:  Reviewed   Meds:  Reviewed   Labs:  Reviewed   Imaging:  Reviewed    Physical Exam:  General:  Vital signs reviewed and noted. Severe obesity.   HEENT:  Normocephalic, atraumatic   Lungs:  Normal respiratory effort. Decreased BS bilaterally.   Heart:  RRR. S1 and S2 normal without gallop, murmur, or rubs.   Abdomen:  BS normoactive. Soft, Nondistended, non-tender.   Extremities:  Neuro:  2+ pitting pretibial edema bilaterally.  AAOx3. CN II-XII intact.    ASSESSMENT/ PLAN:  Patient is a 37 y.o. female with a PMHx of CHF, CKD, T2DM, HTN who was admitted on 09/28/2012 with symptoms of SOB which was determined to be secondary PE (vs CHF).   SOB/PE - proBNP = 6281 and CXR revealing of vascular congestion. Records from [facility] from a hospitalization in 05/2012 revealed EF = 60-65%. Although on admission there was significant concern for CHF exacerbation given pt's reports of a CHF history, concern has now turned more towards pulmonary embolism as most likely etiology of the patient's presentation. PE would seem to be the most cohesive explanation for the patient's elevated proBNP, leukocytosis, relatively sudden onset of shortness of breath, and unusual CXR findings, particularly with knowledge of a normal 2d echo in 05/2012. Pt also has a history of nephrotic syndrome, greatly increasing her risk of developing  VTE. Unfortunately the patient's renal function is worsening, thus CTA cannot be safely pursued. V/Q would not be diagnostic in the setting of patient's underlying CXR abnormalities.  - cont heparin per pharm for presumed PE  - bilateral LE doppler u/s  - cont azithro/ceftriaxone due to CXR findings concerning for PNA (although no clinical evidence to support PNA, thus may consider d/c abx if pt remains afebrile)   CHF - pt endorses history of CHF and home medications are consistent with this, although pt has normal EF in 05/2012, and despite diuresing a net -1.7L since admission, her creatinine is rising (1.7=>1.91).  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 Cr = 1.70 on admission, increased to 1.91 this AM. Likely 2/2 lasix.  - consult nephrology   Nephrotic syndrome - records from [facility] indicated pt has nephrotic syndrome. Spot urine protein from this admission reveal 1000mg /dL on one occurrence, however no 24hr collection performed. Albumin = 1.4.  - 24hr urine protein   Type 2 diabetes mellitus - CBGs well-controlled on SSI and lantus 30U qhs.   Hypertension - BP significantly elevated this AM (202/104). Pt complaining of 10/10 headache, however CT head negative for hemorrhage.  - cont lasix  - cont metolazone  - start norvasc 10mg   Anxiety - this appears to be a significant issue for this patient, although she does not take any psychiatric medications at home. The severity of her anxiety seems unlikely to be related solely to her acute medical conditions, however it is likely significantly worsened from baseline.  - ativan PRN   DVT PPX - heparin  CODE STATUS - full  CONSULTS PLACED - Nephrology  DISPO - Disposition is  deferred at this time, awaiting improvement of current medical problems.  Anticipated discharge in approximately 2-3 day(s).  The patient does have a current PCP (No primary provider on file.) and will not be requiring OPC follow-up after discharge.  The patient does not have transportation limitations that hinder transportation to clinic appointments.

## 2012-09-29 NOTE — Consult Note (Signed)
[facility] - CONSULT NOTE Resident Note    Please see below for attending addendum to resident note.   Date: 09/29/2012                  Patient Name:  Janice Young  MRN: [id number]  DOB: [birth year]  Age / Sex: 37 y.o., female         PCP: No primary provider on file.                 Referring Physician: Dr. Lars Mage                 Reason for Consult: Nephrotic Syndrome            History of Present Illness: Patient is a 37 y.o. female with a PMHx of poorly controlled diabetes, hypertension, and iron-deficiency anemia who was admitted to [facility] on 09/28/2012 for evaluation of possible congestive heart failure exacerbation. Mother and "step-father" at bedside providing majority of history. Pt states that she "just woke up short of breath" thus came to hospital for evaluation.  States that she has had kidney evaluation at [facility] and was suppose to see kidney doctor a few days ago but had this current episode of sob and was hospitalized.  States that he blood sugar has been 200 or greater for some time now.  Denies lower extremity swelling, dysuria, fever chills, cough, or other viral symptoms. Reports that she has been compliant with all medications specifically her diuretics, insulin, and blood pressure medications. Pt has been diabetic since age of 7 with insulin therapy for over 10 years and believes that she has had high blood pressure for over 10 years as well..   Medications: Outpatient medications: Prescriptions prior to admission  Medication Sig Dispense Refill  . amLODipine (NORVASC) 5 MG tablet Take 5 mg by mouth daily.      . furosemide (LASIX) 40 MG tablet Take 40 mg by mouth daily.      . insulin lispro protamine-insulin lispro (HUMALOG 75/25) (75-25) 100 UNIT/ML SUSP Inject 30 Units into the skin 2 (two) times daily with a meal.      . isosorbide mononitrate (IMDUR) 30 MG 24 hr tablet Take 30 mg by mouth daily.      Marland Kitchen lisinopril  (PRINIVIL,ZESTRIL) 10 MG tablet Take 10 mg by mouth 2 (two) times daily.      . metolazone (ZAROXOLYN) 2.5 MG tablet Take 2.5 mg by mouth daily. Take 30 mins before taking Lasix      . Vitamin D, Ergocalciferol, (DRISDOL) 50000 UNITS CAPS Take 50,000 Units by mouth every 7 (seven) days. Takes on Wednesday      . carvedilol (COREG) 25 MG tablet Take 25 mg by mouth 2 (two) times daily with a meal.      . ondansetron (ZOFRAN) 4 MG tablet Take 4 mg by mouth every 8 (eight) hours as needed for nausea.      . pravastatin (PRAVACHOL) 10 MG tablet Take 10 mg by mouth daily.        Current medications: Current Facility-Administered Medications  Medication Dose Route Frequency Provider Last Rate Last Dose  . 0.9 %  sodium chloride infusion  250 mL Intravenous PRN Belia Heman, Janice Young      . acetaminophen (TYLENOL) tablet 650 mg  650 mg Oral Q6H PRN Judie Bonus, Janice Young   650 mg at 09/29/12 0043  . amLODipine (NORVASC) tablet 10 mg  10 mg Oral Daily  Belia Heman, Janice Young   10 mg at 09/29/12 1041  . azithromycin (ZITHROMAX) tablet 250 mg  250 mg Oral Daily Belia Heman, Janice Young   250 mg at 09/29/12 1041  . cefTRIAXone (ROCEPHIN) 1 g in dextrose 5 % 50 mL IVPB  1 g Intravenous Q24H Neema K Sharda, Janice Young   1 g at 09/29/12 1042  . furosemide (LASIX) injection 40 mg  40 mg Intravenous BID Belia Heman, Janice Young   40 mg at 09/29/12 0913  . heparin ADULT infusion 100 units/mL (25000 units/250 mL)  1,450 Units/hr Intravenous Continuous Herby Abraham, RPH   1,450 Units/hr at 09/28/12 2029  . HYDROcodone-acetaminophen (NORCO/VICODIN) 5-325 MG per tablet 1-2 tablet  1-2 tablet Oral Q6H PRN Emory Nani Skillern, Janice Young      . insulin aspart (novoLOG) injection 0-20 Units  0-20 Units Subcutaneous TID WC Neema K Sharda, Janice Young      . insulin glargine (LANTUS) injection 30 Units  30 Units Subcutaneous QHS Belia Heman, Janice Young   30 Units at 09/28/12 2204  . isosorbide mononitrate (IMDUR) 24 hr tablet 30 mg  30 mg Oral Daily Neema Davina Poke, Janice Young   30  mg at 09/29/12 1041  . levalbuterol (XOPENEX) nebulizer solution 0.63 mg  0.63 mg Nebulization Q6H PRN Neema Davina Poke, Janice Young      . LORazepam (ATIVAN) injection 1 mg  1 mg Intravenous Q4H PRN Elfredia Nevins, Janice Young      . metolazone (ZAROXOLYN) tablet 2.5 mg  2.5 mg Oral Daily Neema Davina Poke, Janice Young   2.5 mg at 09/29/12 1041  . sodium chloride 0.9 % injection 3 mL  3 mL Intravenous Q12H Neema Davina Poke, Janice Young   3 mL at 09/29/12 1000  . sodium chloride 0.9 % injection 3 mL  3 mL Intravenous PRN Belia Heman, Janice Young          Allergies: No Known Allergies    Past Medical History: Past Medical History  Diagnosis Date  . Renal failure     Stage 1  . Hypertension   . Diabetes mellitus without complication 1999  . Anemia Nov 2013  . History of nuclear stress test 05/30/12    Subtle reversible defect in the anterior septum at the junction of the mid ventricle and apex without corresponding wall motion abnormality, EF 60%  . Nephrotic syndrome 05/2012    Advanced diabetic nephrosclerosis; bx at [facility]     Past Surgical History: Past Surgical History  Procedure Laterality Date  . Cesarean section      x2  . Tonsillectomy    . Tubal ligation       Family History: Family History  Problem Relation Age of Onset  . Epilepsy Father   . Transient ischemic attack Mother   . Cancer - Other Mother     brain  . Hypertension Brother      Social History:  reports that she has never smoked. She has never used smokeless tobacco. She reports that she does not drink alcohol or use illicit drugs.   Review of Systems:  Constitutional:  Denies fever, chills, diaphoresis, appetite change and fatigue.  HEENT: Denies congestion, sore throat, rhinorrhea  Respiratory: Denies cough, chest tightness, or  Wheezing. Endorses sob  Cardiovascular: Endorses chest pressure, denies palpitations and leg swelling.  Gastrointestinal: Denies nausea, vomiting, abdominal pain, diarrhea, constipation, blood in  stool and abdominal distention.  Genitourinary: Denies dysuria, urgency, frequency, hematuria, difficulty urinating.  Musculoskeletal: Denies myalgias,  back pain, joint swelling, arthralgias and gait problem.   Skin: Endorses frequent pimples over her body but denies overt rashes  Neurological: Denies dizziness, seizures, syncope, weakness, light-headedness, numbness and headaches.   Hematological: Denies bruising    Vital Signs: Blood pressure 164/104, pulse 109, temperature 98.3 F (36.8 C), temperature source Oral, resp. rate 22, height 5\' 8"  (1.727 m), weight 285 lb 11.5 oz (129.6 kg), last menstrual period 09/08/2012, SpO2 100.00%.  Weight trends: Filed Weights   09/28/12 1748 09/28/12 1854 09/29/12 0523  Weight: 288 lb 1.6 oz (130.681 kg) 287 lb 14.7 oz (130.6 kg) 285 lb 11.5 oz (129.6 kg)    Physical Exam: General: morbidly obese AA female, in no acute distress; lying on her side speaking with eyes closed HEENT: Normocephalic, atraumatic, PERRLA, EOMI, No signs of anemia or jaundice, Moist mucous membranes, no purulence, normal turbinates, Oropharynx nonerythematous, no exudate appreciated, supple, no masses, no carotid Bruits, no JVD appreciated. Lungs: Normal respiratory effort. Clear to auscultation bilaterally from apices to bases without crackles or wheezes appreciated. Heart: normal rate, regular rhythm, normal S1 and S2, no gallop, murmur, or rubs appreciated. Abdomen: BS normoactive. Soft, Nondistended, non-tender. No masses or organomegaly appreciated. Extremities: 1-2+ pitting LE edema, distal pulses intact Neurologic: grossly non-focal, lethargic appearing after Ativan but arousible and responsive, oriented x3    Lab results: Basic Metabolic Panel:  Recent Labs Lab 09/28/12 0735 09/28/12 1457 09/28/12 1833 09/29/12 0655  NA 139 136  --  137  K 4.2 3.8  --  3.4*  CL 112 106  --  106  CO2  --  18*  --  18*  GLUCOSE 287* 259*  --  116*  BUN 25* 23  --  24*   CREATININE 1.70* 1.77*  --  1.91*  CALCIUM  --  8.5  --  8.4  MG  --   --  2.0  --   PHOS  --   --  3.7  --     Liver Function Tests:  Recent Labs Lab 09/28/12 1457  AST 21  ALT 19  ALKPHOS 100  BILITOT 0.1*  PROT 5.9*  ALBUMIN 1.4*    CBC:  Recent Labs Lab 09/28/12 0719 09/28/12 0735 09/29/12 0655  WBC 16.2*  --  11.4*  NEUTROABS 12.8*  --  7.6  HGB 10.6* 9.5* 10.1*  HCT 30.8* 28.0* 29.9*  MCV 81.5  --  81.5  PLT 433*  --  463*    Cardiac Enzymes:  Recent Labs Lab 09/28/12 1457 09/28/12 1833 09/29/12 0014 09/29/12 0655  TROPONINI <0.30 <0.30 <0.30 <0.30    CBG:  Recent Labs Lab 09/28/12 1246 09/28/12 1755 09/28/12 2103 09/29/12 0555  GLUCAP 283* 109* 165* 112*      Imaging: Dg Chest 2 View  09/28/2012  *RADIOLOGY REPORT*  Clinical Data: Chest pain and shortness of breath.  CHEST - 2 VIEW  Comparison: Chest x-ray 09/28/2012.  Findings: Lung volumes are slightly low.  Extensive bibasilar opacities (left greater than right), favored to reflect sequelae of aspiration or developing bilateral lower lobe bronchopneumonia. Probable small bilateral pleural effusions.  Pulmonary venous congestion, without frank pulmonary edema.  Heart size is borderline enlarged.  Upper mediastinal contours are slightly distorted by patient rotation to the left.  IMPRESSION: 1.  Extensive bibasilar air space disease concerning for sequelae of recent aspiration or developing bilateral lower lobe bronchopneumonia. 2.  Probable small bilateral pleural effusions (left greater than right).   Original Report Authenticated By: Trudie Reed,  M.D.    Ct Head Wo Contrast  09/29/2012  *RADIOLOGY REPORT*  Clinical Data: Rule out hemorrhagic stroke.  Migraine symptoms.  CT HEAD WITHOUT CONTRAST  Technique:  Contiguous axial images were obtained from the base of the skull through the vertex without contrast.  Comparison: None  Findings: Ventricle size is normal.  Negative for acute or  chronic infarct.  Negative for intracranial hemorrhage or mass lesion. Calvarium intact.  IMPRESSION: Negative   Original Report Authenticated By: Janeece Riggers, M.D.       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 Cammy Copa for AKI and nephrotic syndrome. Creatinine level (Nov 2013) 1.2-1.4; Outpt records show Renal US w/o hydronephrosis, no renal calculi, normal echogenicity but enlarged at 13.7 cm right kidney  and 14.6 cm left kidney; glomerular basement membrane Neg, Neutrophil cytoplasm Ab Neg including p-ANCA and c-ANCA, HIV nonreactive; serum immunofixation w/o monoclonal proteins, and urine-protein creatine ratio in nephrotic range (9853 mg/dL). Pt uses Goody powders and Aleve.  Outpt medications included ACEi (lisinopril), lasix and metolazone. Pt will need tight glycemic control and management of hypertension with ACEi to reduce the progression although appears that compliance is major issue. Pt may need reduced dietary salt intake (5-6 g/d), phosphorus and potassium restriction. Plan: -check urinalysis to r/o nephritic picture -urine 24h protein (pending) to quantify protein losses and GFR estimation -cont ACEi and diuresis with close serum creatinine monitoring -monitor I/Os -supplement potassium as needed for hyperkalemia likely secondary to diuretic use   Recent Labs Lab 09/28/12 0735 09/28/12 1457 09/29/12 0655  CREATININE 1.70* 1.77* 1.91*    Recent Labs Lab 09/28/12 0735 09/28/12 1457 09/29/12 0655  K 4.2 3.8 3.4*    2. Congestive Heart Failure, exacerbation likely: on Lasix and metolazone, will likely need continued diuresis as tolerated by kidney function.   -cont carvedilol and amlodipine per primary team -2D ECHO pending  3. Diabetes Mellitus, uncontrolled:  insulin dependent, previous HgbA1c >13% -management per primary team  4. Hypertension: outpt regimen of amlodipine 5 mg qd, carvedilol 25 mg bid, lisinopril 10 mg bid  5. Anxiety with Panic Attacks: currently on Ativan prn  Patient history and plan of care reviewed with attending, Dr. Briant Cedar.   Janice Schwartz, Janice Young  PGYII, Internal Medicine Resident 09/29/2012, 11:36 AM I have seen and examined this patient and agree with plan  Per Dr Bosie Clos.  37yo BF with bx proven diabetic glomerulosclerosis admitted with SOB.  Poorly controlled DM and HTN.  Followed by nephrologist in [facility].  Scr 11/13 was 1.6, yest 1.7 and today 1.9.  no info from [facility] in chart. REC: 1.  When you have info from an outside source that is critical to consultants then please make a copy and put in chart 2.  Please refer to Up To Date for management of diabetic nephropathy as every internist should understand how to treat this 3. Diurese with lasix 80 mg 2-3x/d and increase based on response.  DC zaroxolyn and use only when max doses of lasix are not working.  At this stage it will only create more problems with hypokalemia. 4. Hold ACE until diuresed 5. Daily Scr 6. Replace K, x 1 7. DC 24 hr urine, already know she has significant proteinuria and nephrotic syndrome.  Use Pr/Cr ratio in future to track trends of proteinuria  Janice Young,Janice Young,Janice Young 09/29/2012 1:28 PM

## 2012-09-29 NOTE — Progress Notes (Signed)
  Echocardiogram 2D Echocardiogram has been performed.  Janice Young, Janice Young 09/29/2012, 3:03 PM

## 2012-09-29 NOTE — Progress Notes (Signed)
Called by primary RN for patient c/o chest pain and blood noted in patients urine, while on heparin drip.  RN and Md at bedside.  Patient on 35% ventimask, skin warm and dry. Labs ordered, patient states her chest pain is better, no Respiratory distress noted.  EKG done prior to my arrival, results reviewed.  Patient has started her menses.  No RRT intervention Rn to call if assistance needed.

## 2012-09-29 NOTE — Progress Notes (Signed)
Subjective: - I was called to see patient because of " hematuria". Blood clot in urine was seen by both patient and her nurse.  - Patient also reports episodic chest pain. It is located at the front chest, 5/10, sharp, pleuritic and non-radiating. It lasts for about 5 min. It is aggravated by deep breath.   -patient is on oxygen mask, 3.5 L/min with O2 saturation of 100%.   -Patein is tachycardia with HR of 108/ min when I saw patient.   -Patein's 2D echo: EF 55% to 60%. Possible hypokinesis of the midinferoseptal myocardium. Doppler parameters are consistent with abnormal left ventricular relaxation (grade 1 diastolic dysfunction).  -Stat EKG: no significant change compared to previous one on 09/28/12.   Objective: Vital signs in last 24 hours: Filed Vitals:   09/29/12 0050 09/29/12 0523 09/29/12 0949 09/29/12 1700  BP: 202/104 194/92 164/104 201/99  Pulse: 108 113 109 108  Temp:  98.3 F (36.8 C)  97.9 F (36.6 C)  TempSrc:  Oral  Axillary  Resp:  22  22  Height:      Weight:  285 lb 11.5 oz (129.6 kg)    SpO2:  100%  100%     Weight change:   Intake/Output Summary (Last 24 hours) at 09/29/12 1851 Last data filed at 09/29/12 1745  Gross per 24 hour  Intake 812.49 ml  Output   2400 ml  Net -1587.51 ml   Physical Exam:   General:   Vital signs reviewed and noted. Severe obesity.    HEENT:   Normocephalic, atraumatic    Lungs:  Normal respiratory effort. Decreased BS bilaterally.    Heart:   RRR. S1 and S2 normal without gallop, murmur, or rubs.    Abdomen:  BS normoactive. Soft, Nondistended, non-tender.    Extremities:    Neuro:   2+ pitting pretibial edema bilaterally.    AAOx3. CN II-XII intact.      Lab Results: Basic Metabolic Panel:  Recent Labs Lab 09/28/12 1457 09/28/12 1833 09/29/12 0655  NA 136  --  137  K 3.8  --  3.4*  CL 106  --  106  CO2 18*  --  18*  GLUCOSE 259*  --  116*  BUN 23  --  24*  CREATININE 1.77*  --  1.91*  CALCIUM 8.5  --  8.4   MG  --  2.0  --   PHOS  --  3.7  --    Liver Function Tests:  Recent Labs Lab 09/28/12 1457  AST 21  ALT 19  ALKPHOS 100  BILITOT 0.1*  PROT 5.9*  ALBUMIN 1.4*   No results found for this basename: LIPASE, AMYLASE,  in the last 168 hours No results found for this basename: AMMONIA,  in the last 168 hours CBC:  Recent Labs Lab 09/28/12 0719 09/28/12 0735 09/29/12 0655  WBC 16.2*  --  11.4*  NEUTROABS 12.8*  --  7.6  HGB 10.6* 9.5* 10.1*  HCT 30.8* 28.0* 29.9*  MCV 81.5  --  81.5  PLT 433*  --  463*   Cardiac Enzymes:  Recent Labs Lab 09/28/12 1833 09/29/12 0014 09/29/12 0655  TROPONINI <0.30 <0.30 <0.30   BNP:  Recent Labs Lab 09/28/12 0719  PROBNP 6281.0*   D-Dimer: No results found for this basename: DDIMER,  in the last 168 hours CBG:  Recent Labs Lab 09/28/12 1246 09/28/12 1755 09/28/12 2103 09/29/12 0555 09/29/12 1113 09/29/12 1702  GLUCAP 283* 109* 165* 112*  117* 116*   Hemoglobin A1C: No results found for this basename: HGBA1C,  in the last 168 hours Fasting Lipid Panel: No results found for this basename: CHOL, HDL, LDLCALC, TRIG, CHOLHDL, LDLDIRECT,  in the last 168 hours Thyroid Function Tests: No results found for this basename: TSH, T4TOTAL, FREET4, T3FREE, THYROIDAB,  in the last 168 hours Coagulation: No results found for this basename: LABPROT, INR,  in the last 168 hours Anemia Panel: No results found for this basename: VITAMINB12, FOLATE, FERRITIN, TIBC, IRON, RETICCTPCT,  in the last 168 hours Urine Drug Screen: Drugs of Abuse     Component Value Date/Time   LABOPIA NONE DETECTED 09/28/2012 1126   COCAINSCRNUR NONE DETECTED 09/28/2012 1126   LABBENZ NONE DETECTED 09/28/2012 1126   AMPHETMU NONE DETECTED 09/28/2012 1126   THCU NONE DETECTED 09/28/2012 1126   LABBARB NONE DETECTED 09/28/2012 1126    Alcohol Level: No results found for this basename: ETH,  in the last 168 hours Urinalysis:  Recent Labs Lab 09/29/12 1210   COLORURINE YELLOW  LABSPEC 1.010  PHURINE 7.0  GLUCOSEU 100*  HGBUR LARGE*  BILIRUBINUR NEGATIVE  KETONESUR NEGATIVE  PROTEINUR >300*  UROBILINOGEN 0.2  NITRITE NEGATIVE  LEUKOCYTESUR NEGATIVE   Misc. Labs:  Micro Results: No results found for this or any previous visit (from the past 240 hour(s)). Studies/Results: Dg Chest 2 View  09/28/2012  *RADIOLOGY REPORT*  Clinical Data: Chest pain and shortness of breath.  CHEST - 2 VIEW  Comparison: Chest x-ray 09/28/2012.  Findings: Lung volumes are slightly low.  Extensive bibasilar opacities (left greater than right), favored to reflect sequelae of aspiration or developing bilateral lower lobe bronchopneumonia. Probable small bilateral pleural effusions.  Pulmonary venous congestion, without frank pulmonary edema.  Heart size is borderline enlarged.  Upper mediastinal contours are slightly distorted by patient rotation to the left.  IMPRESSION: 1.  Extensive bibasilar air space disease concerning for sequelae of recent aspiration or developing bilateral lower lobe bronchopneumonia. 2.  Probable small bilateral pleural effusions (left greater than right).   Original Report Authenticated By: Trudie Reed, M.D.    Ct Head Wo Contrast  09/29/2012  *RADIOLOGY REPORT*  Clinical Data: Rule out hemorrhagic stroke.  Migraine symptoms.  CT HEAD WITHOUT CONTRAST  Technique:  Contiguous axial images were obtained from the base of the skull through the vertex without contrast.  Comparison: None  Findings: Ventricle size is normal.  Negative for acute or chronic infarct.  Negative for intracranial hemorrhage or mass lesion. Calvarium intact.  IMPRESSION: Negative   Original Report Authenticated By: Janeece Riggers, M.D.    Medications:  Scheduled Meds: . amLODipine  10 mg Oral Daily  . azithromycin  250 mg Oral Daily  . cefTRIAXone (ROCEPHIN)  IV  1 g Intravenous Q24H  . furosemide  80 mg Intravenous BID  . insulin aspart  0-20 Units Subcutaneous TID WC  .  insulin glargine  30 Units Subcutaneous QHS  . isosorbide mononitrate  30 mg Oral Daily  . sodium chloride  3 mL Intravenous Q12H   Continuous Infusions: . heparin 1,450 Units/hr (09/29/12 1144)   PRN Meds:.sodium chloride, acetaminophen, HYDROcodone-acetaminophen, levalbuterol, LORazepam, sodium chloride Assessment/Plan:  #: Vaginal bleeding: Nurse and I did vaginal exam, which revealed that patient has vaginal bleeding clearly. It is likely her period. She reports that her last menstrual period was 09/08/12. Her period was usually regular, about every 30 days. She had tubal ligation at 12 year ago, it is less likely that she has  abortion or ectopic pregnancy. But there is 1% of pregnant possibility with tubal ligation.   -will check pregnancy test. -will get CBC stat. -will give Peri-pad -Will check stat heparin level  # Chest pain: It is pleuretic. Patient is tachycardia. Given her hx of nephrotic syndrome. I agree with primary team that patient is at high risk for PE. Her Hgb is 10.1 in AM. She is hemodynamically stable. Actually bp is significantly elevated at 201/99 mmHg.  - will continue Heparin and check heparin level. Nurse already called pharmacy for checking heparin level. - will sign out to night colleagues for close monitoring Hgb. If dropping, will need to stop heparin. - it is less likely due to ACS (EKG no change), but given her hx of DM and HTN, will get troponin X1.  #: HTN: bp is 201/99 mmHg now. She is on amlodipine, and lasix 80 bid and imdur for CHF.  -will schedule hydralazine 25 mg q4h, primary team may change tomorrow. -will not start BB given her SOB.    LOS: 1 day   Lorretta Harp 09/29/2012, 6:51 PM

## 2012-09-29 NOTE — Progress Notes (Signed)
Pharmacy - Heparin  Asked to recheck heparin level due to new hematuria. Heparin level is 0.27 (slightly sub-therapeutic)  Plan: 1) No change in heparin 2) Follow up in AM  Thank you. Okey Regal, PharmD [phone]

## 2012-09-29 NOTE — Progress Notes (Signed)
C/o chest pain, mid sternum chest pain.  Vital signs taken.  Skin warm and dry to touch.  No JVD.  Denies nausea.  MD paged and answered,  Paged on call MD. Rapid Response called also.

## 2012-09-29 NOTE — Progress Notes (Signed)
Medical Student Daily Progress Note  Subjective: This morning, Janice Young complains of a headache, described as bilateral pressure through her forehead.  She received 2 tylenol last night which did not help the pain; when she woke up this morning it was worse.  She describes the h/a as a 10/10; the worst headache of her life.  She has no regular history of headaches or migraines in the past.    She continues to endorse SOB this morning both while lying down and walking to the bedside toilet.  Her SOB hasn't improved much overnight.  She also continues to complain of substernal chest pressure and R hand numbness.  She continues to have nausea and vomited 1x last night.    Ms. Ayo cough persists but she denies any hemoptysis and produces clear sputum.  But she denies fever, chills overnight.  She does endorse nasal congestion today and a nose bleed producing moderate amounts of blood when she blows her nose, but stopping on it's own.  This morning, Janice Young describes some difficulty with urination (described as the "urge to go" but no urination when she "sits on the toilet") but was able to urinate last night.  While the team was interviewing Janice Young this morning, she became tearful with tachypnea and worsening SOB.  She then became sluggish and was not responding to commands for 30sec-1 min.  No convulsions or urinary/bowel incontinence were noted.  She was unable to keep her eyes open and follow commands for assessing EOM.  She also endorsed some word finding difficulties, but was able to repeat and speak properly after a couple minutes.  After returning to her baseline a couple minutes later, Janice Young does endorse panic attacks that sometimes cause her to have decreased responsiveness.  Objective: Vital signs in last 24 hours: Filed Vitals:   09/29/12 0031 09/29/12 0050 09/29/12 0523 09/29/12 0949  BP: 205/94 202/104 194/92 164/104  Pulse: 111 108 113 109  Temp:   98.3 F (36.8 C)   TempSrc:    Oral   Resp:   22   Height:      Weight:   129.6 kg (285 lb 11.5 oz)   SpO2:   100%    Weight change:   Intake/Output Summary (Last 24 hours) at 09/29/12 1011 Last data filed at 09/29/12 4540  Gross per 24 hour  Intake 812.49 ml  Output   2550 ml  Net -1737.51 ml   Physical Exam: General: lying in bed with her eyes closed holding her head, taking shallow breaths and SOB upon talking HEENT: PERRL, no scleral icterus, unable to assess EOM due to patient not able/willing to keep her eyes open even with prompting Cardiac: RRR, no rubs, murmurs or gallops Pulm: decreased air movement bilaterally; moderate increased WOB Abd: obese, soft, nontender, nondistended, BS present Ext: warm and well perfused, bilateral 2+ pedal edema Neuro: patient would only open her eyes for a few seconds at a time; strength grossly normal in upper extremities; word finding difficulties, but repetition and fluency were intact.  Unable to assess additional exam due to patient's inability/uncooperativeness  Lab Results: CBC    Component Value Date/Time   WBC 11.4* 09/29/2012 0655   RBC 3.67* 09/29/2012 0655   HGB 10.1* 09/29/2012 0655   HCT 29.9* 09/29/2012 0655   PLT 463* 09/29/2012 0655   MCV 81.5 09/29/2012 0655   MCH 27.5 09/29/2012 0655   MCHC 33.8 09/29/2012 0655   RDW 13.5 09/29/2012 0655   LYMPHSABS  1.8 09/29/2012 0655   MONOABS 1.0 09/29/2012 0655   EOSABS 1.0* 09/29/2012 0655   BASOSABS 0.0 09/29/2012 0655   CMP     Component Value Date/Time   NA 137 09/29/2012 0655   K 3.4* 09/29/2012 0655   CL 106 09/29/2012 0655   CO2 18* 09/29/2012 0655   GLUCOSE 116* 09/29/2012 0655   BUN 24* 09/29/2012 0655   CREATININE 1.91* 09/29/2012 0655   CALCIUM 8.4 09/29/2012 0655   PROT 5.9* 09/28/2012 1457   ALBUMIN 1.4* 09/28/2012 1457   AST 21 09/28/2012 1457   ALT 19 09/28/2012 1457   ALKPHOS 100 09/28/2012 1457   BILITOT 0.1* 09/28/2012 1457   GFRNONAA 33* 09/29/2012 0655   GFRAA 38* 09/29/2012 0655    Heparin Unfractionated >2.00 at 09/29/12  0655 0.39 at  09/29/12 5409  Micro Results: No results found for this or any previous visit (from the past 240 hour(s)). Studies/Results: Dg Chest 2 View  09/28/2012  *RADIOLOGY REPORT*  Clinical Data: Chest pain and shortness of breath.  CHEST - 2 VIEW  Comparison: Chest x-ray 09/28/2012.  Findings: Lung volumes are slightly low.  Extensive bibasilar opacities (left greater than right), favored to reflect sequelae of aspiration or developing bilateral lower lobe bronchopneumonia. Probable small bilateral pleural effusions.  Pulmonary venous congestion, without frank pulmonary edema.  Heart size is borderline enlarged.  Upper mediastinal contours are slightly distorted by patient rotation to the left.  IMPRESSION: 1.  Extensive bibasilar air space disease concerning for sequelae of recent aspiration or developing bilateral lower lobe bronchopneumonia. 2.  Probable small bilateral pleural effusions (left greater than right).   Original Report Authenticated By: Trudie Reed, M.D.    Ct Head Wo Contrast  09/29/2012  *RADIOLOGY REPORT*  Clinical Data: Rule out hemorrhagic stroke.  Migraine symptoms.  CT HEAD WITHOUT CONTRAST  Technique:  Contiguous axial images were obtained from the base of the skull through the vertex without contrast.  Comparison: None  Findings: Ventricle size is normal.  Negative for acute or chronic infarct.  Negative for intracranial hemorrhage or mass lesion. Calvarium intact.  IMPRESSION: Negative   Original Report Authenticated By: Janeece Riggers, M.D.    Medications: I have reviewed the patient's current medications. Scheduled Meds: . amLODipine  10 mg Oral Daily  . azithromycin  250 mg Oral Daily  . cefTRIAXone (ROCEPHIN)  IV  1 g Intravenous Q24H  . furosemide  40 mg Intravenous BID  . insulin aspart  0-20 Units Subcutaneous TID WC  . insulin glargine  30 Units Subcutaneous QHS  . isosorbide mononitrate  30 mg Oral Daily  . metolazone  2.5 mg Oral Daily  .  morphine  injection  2 mg Intravenous Once  . sodium chloride  3 mL Intravenous Q12H   Continuous Infusions: . heparin Stopped (09/29/12 0930)   PRN Meds:.sodium chloride, acetaminophen, HYDROcodone-acetaminophen, levalbuterol, LORazepam, sodium chloride Assessment/Plan: Janice Young is a 37yo female with hx nephrotic syndrome, CHF, T2DM, and HTN that presents to the ED complaining of SOB for the past 5 days and a 2 day history of chest pressure that continues to persist.  This morning she complains of a 10/10 headache, "the worst of her life."   Principal Problems:   New Onset Headache: Janice Young describes this headache as 10/10, "the worst of her life" after being started on heparin last night.  Concern is for ICH or subarachnoid hemorrhage, given hx of hypertension with elevated pressures last night (>200/100) and being started on heparin  for suspected PE. Especially since she experienced a period of decreased responsiveness (<79min) and was unable to follow commands to assess EOM and some word finding difficulties, emergent workup is being done.   - stop heparin currently   - stat head CT returned negative for ICH   -  Ativan 1mg  IV given to reduce anxiety   - morphine 2mg  IV given for pain   Shortness of breath  DDX: CHF exacerbation, CAP, MI, PE, panic attacks  Janice Young endorses an 8lb weight gain over the past 3 days and increasing bilateral LLE and running out of her medications last week. Her proBNP is elevated at 6281 and serial troponins are WNL . She has been afebrile without sick contacts; however there is leukocytosis. Chest xray showed both pulmonary venous congestion and bilateral lobe bronchopneumonia. OSH records indicate her hospitalization in Nov 2013 showed an EF of 60-65%, but per patient she has diastolic dysfunction. Her admission to OSH in November 2013 was very similar to this presentation with LE swelling, elevated proBNP, leukocytosis and SOB, but chest xray did not show pulmonary  edema. 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 Urine output is not as high as would be expected on IV lasix and pedal edema has not improved significantly overnight as would be expected in purely CHF exacerbation. While chest pain and RR might increase when she exhibits panic attacks; she still has baseline pain/pressure and VS instability suggestive of another underlying etiology.     - telemetry monitoring   - mag & phos returned WNL  - repeat EKG in AM   CHF workup:   - repeat cardiac echo since Nov 2013: EF 60-65% at OSH   - troponins neg x3  - change to IV lasix 40 BID (home dose 40mg  oral BID per patient but 40mg  oral daily per pharmacy).   -continue home isosorbide mononitrate   - strict I/O   -daily weights   -start labetalol since pt states she was on coreg at home   PE workup:   -ABG shows pO2 72.0 and pCO2 29.5; which makes shunt or V/Q mismatch likely   -A-a gradient on 100% O2 showed inconclusive result   -holding heparin subcutaneous currently for concern of ICH   -creatinine increased today to 1.91;  CTA could cause more kidney damage currently  -consider V/Q scan once consolidation/pulmonary edema resolves   - LE venous duplex to r/o DVT   CAP workup:   -continue azithromycin and ceftriaxone to cover for bronchopneumonia    -levalbuterol nebulizer q6hrs PRN for SOB   Active Problems:  Acute on chronic renal failure/Nephrotic Syndrome: creatinine 1.91 today; 1.7 on admission; 1.45 at OSH (Nov 2013), 0.9 prior to Nov 2013 admission. OSH records indicate nephrotic syndrome due to diabetes.  Urine output is not as high as would be expected on IV lasix and pedal edema has not improved significantly overnight.  Elevated creatinine this morning is concerning for further renal decline.    - renal consult   - 24 hr urine protein collection   - stop lisinopril due to elevated creatinine  Diabetes  mellitus, type 2: per OSH records A1c >13.7 in Nov 2013   - check A1c   - SSI + 30 units lantus qHS  Hypertension: BP elevated on admission (150/103-171/99)   -continue home amlodipine, metalzsone  Anemia: per patient required a blood transfusion in Nov 2013; currently on iron supplement at home  -  CBC showed stable hemoglobin --> no acute bleed yesterday    LOS: 1 day   This is a Psychologist, occupational Note.  The care of the patient was discussed with Dr. Lavena Bullion and the assessment and plan formulated with their assistance.  Please see their attached note for official documentation of the daily encounter.  Esau Grew 09/29/2012, 10:11 AM

## 2012-09-30 DIAGNOSIS — R0602 Shortness of breath: Secondary | ICD-10-CM

## 2012-09-30 LAB — RENAL FUNCTION PANEL
Albumin: 1.3 g/dL — ABNORMAL LOW (ref 3.5–5.2)
BUN: 24 mg/dL — ABNORMAL HIGH (ref 6–23)
GFR calc non Af Amer: 29 mL/min — ABNORMAL LOW (ref >90)
Phosphorus: 4.8 mg/dL — ABNORMAL HIGH (ref 2.3–4.6)
Potassium: 3.8 mEq/L (ref 3.5–5.1)
Sodium: 136 mEq/L (ref 135–145)

## 2012-09-30 LAB — GLUCOSE, CAPILLARY
Glucose-Capillary: 101 mg/dL — ABNORMAL HIGH (ref 70–99)
Glucose-Capillary: 123 mg/dL — ABNORMAL HIGH (ref 70–99)

## 2012-09-30 LAB — HIV ANTIBODY (ROUTINE TESTING W REFLEX): HIV: NONREACTIVE

## 2012-09-30 LAB — CBC
MCV: 82.9 fL (ref 78.0–100.0)
Platelets: 466 10*3/uL — ABNORMAL HIGH (ref 150–400)
RDW: 13.8 % (ref 11.5–15.5)
WBC: 9.6 10*3/uL (ref 4.0–10.5)

## 2012-09-30 LAB — HEPARIN LEVEL (UNFRACTIONATED): Heparin Unfractionated: 0.29 IU/mL — ABNORMAL LOW (ref 0.30–0.70)

## 2012-09-30 LAB — PROTEIN, URINE, 24 HOUR
Collection Interval-UPROT: 24 hours
Protein, Urine: 523 mg/dL

## 2012-09-30 LAB — HEMOGLOBIN A1C: Mean Plasma Glucose: 192 mg/dL — ABNORMAL HIGH (ref <117)

## 2012-09-30 MED ORDER — ONDANSETRON HCL 4 MG PO TABS
4.0000 mg | ORAL_TABLET | Freq: Four times a day (QID) | ORAL | Status: DC | PRN
  Administered 2012-10-02: 4 mg via ORAL
  Filled 2012-09-30: qty 1

## 2012-09-30 MED ORDER — ENOXAPARIN SODIUM 150 MG/ML ~~LOC~~ SOLN
1.0000 mg/kg | Freq: Two times a day (BID) | SUBCUTANEOUS | Status: DC
  Administered 2012-09-30 – 2012-10-02 (×5): 130 mg via SUBCUTANEOUS
  Filled 2012-09-30 (×6): qty 1

## 2012-09-30 MED ORDER — CARVEDILOL 25 MG PO TABS
25.0000 mg | ORAL_TABLET | Freq: Two times a day (BID) | ORAL | Status: DC
  Administered 2012-09-30 – 2012-10-02 (×4): 25 mg via ORAL
  Filled 2012-09-30 (×6): qty 1

## 2012-09-30 MED ORDER — LORAZEPAM 0.5 MG PO TABS
0.5000 mg | ORAL_TABLET | Freq: Four times a day (QID) | ORAL | Status: DC
  Administered 2012-09-30 – 2012-10-02 (×8): 0.5 mg via ORAL
  Filled 2012-09-30 (×8): qty 1

## 2012-09-30 MED ORDER — ONDANSETRON HCL 4 MG/2ML IJ SOLN
4.0000 mg | Freq: Four times a day (QID) | INTRAMUSCULAR | Status: DC | PRN
  Administered 2012-09-30 – 2012-10-01 (×2): 4 mg via INTRAVENOUS
  Filled 2012-09-30 (×2): qty 2

## 2012-09-30 MED ORDER — WARFARIN - PHARMACIST DOSING INPATIENT
Freq: Every day | Status: DC
  Administered 2012-09-30: 18:00:00

## 2012-09-30 MED ORDER — WARFARIN SODIUM 10 MG PO TABS
10.0000 mg | ORAL_TABLET | Freq: Once | ORAL | Status: AC
  Administered 2012-09-30: 10 mg via ORAL
  Filled 2012-09-30: qty 1

## 2012-09-30 MED ORDER — COUMADIN BOOK
Freq: Once | Status: AC
  Administered 2012-09-30: 11:00:00
  Filled 2012-09-30: qty 1

## 2012-09-30 MED ORDER — WARFARIN VIDEO
Freq: Once | Status: AC
  Administered 2012-09-30: 11:00:00

## 2012-09-30 NOTE — Progress Notes (Signed)
Resident Addendum to Medical Student Note   I have seen and examined the patient, and agree with the the medical student assessment and plan outlined above. Please see my brief note below for additional details.  S: Patient feeling much better today. SOB is much improved. No CP. Denies any headache today.   Interval events: There was concern yesterday evening for hematuria, however it was determined that this was more consistent with blood from the patient's current menses.    OBJECTIVE: VS: Reviewed  Meds: Reviewed  Labs: Reviewed  Imaging: Reviewed   Physical Exam: General: Vital signs reviewed and noted. Severely obese.  HEENT: Normocephalic, atraumatic  Lungs:  Normal respiratory effort. Decreased breath sounds bilaterally.  Heart: RRR. S1 and S2 normal without gallop, murmur, or rubs.  Abdomen:  BS normoactive. Soft, Nondistended, non-tender.  Extremities: 2+ pedal edema bilaterally.     ASSESSMENT/ PLAN: Patient is a 37 y.o. female with a PMHx of CHF, CKD, T2DM, HTN who was admitted on 09/28/2012 with symptoms of SOB which was determined to be secondary PE (vs CHF).   Acute pulmonary embolism -  proBNP = 6281 and CXR revealing of vascular congestion. Records from [facility] regional from a hospitalization in 05/2012 revealed EF = 60-65%. Although on admission there was significant concern for CHF exacerbation given pt's reports of a CHF history, concern has now turned more towards pulmonary embolism as most likely etiology of the patient's presentation. PE would seem to be the most cohesive explanation for the patient's elevated proBNP, leukocytosis, relatively sudden onset of shortness of breath, and unusual CXR findings, particularly with knowledge of a normal 2d echo in 05/2012. Pt also has a history of nephrotic syndrome, greatly increasing her risk of developing VTE. Unfortunately the patient's renal function is worsening, thus CTA cannot be safely pursued. V/Q would not be  diagnostic in the setting of patient's underlying CXR abnormalities. Doppler LE negative for DVT (preliminary report).  - d/c heparin => start lovenox/coumadin bridge - d/c antibiotics, as no clinical evidence of PNA  Grade 1 Diastolic CHF - Echo on 3/10 reveals EF = 55-60% with grade 1 diastolic dysfunction. Patient has diuresed a net -2.6L since admission, but her creatinine is rising (1.7=>1.91 => 2.12), thus it appears she is likely not in CHF exacerbation, but her elevated proBNP is more likely 2/2 acute pulmonary of which there is very strong clinical suspicion.  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 Cr = 1.70 on admission, increased to 1.91 this AM. Likely 2/2 lasix.  - continue diuresis per nephro  Nephrotic syndrome - records from [facility] regional indicated pt has nephrotic syndrome. Spot urine protein from this admission reveal 1000mg /dL on one occurrence, however no 24hr collection performed. Albumin = 1.4.  - 24hr urine protein results pending (was cancelled by nephro, however collection was still completed)  Type 2 diabetes mellitus - CBGs well-controlled on SSI and lantus 30U qhs.   Hypertension - BP remains elevated, although improved this AM. She was started on hydralazine PRN yesterday evening, however as her BP is improved and her home coreg is being restarted, we will d/c this at this time. - cont lasix  - cont metolazone  - cont  norvasc - cont imdur - start coreg 25mg  bid (home dose)  Anxiety/panic attacks - this appears to be a significant issue for this patient, although she does not take any psychiatric medications at home. The severity of her anxiety seems unlikely to be related solely to her acute medical  conditions, however it is likely significantly worsened from baseline.  - ativan 0.5mg  PO q6h (scheduling as pt has significant anxiety and appears not to be asking for or receiving PRN ativan) - ativan PRN  DVT PPX - lovenox/coumadin CODE STATUS - full  CONSULTS PLACED - Nephrology  DISPO - Disposition is deferred at this time, awaiting improvement of current medical problems.  Anticipated discharge in approximately 1-2 day(s).  The patient does have a current PCP (No primary Tobin Cadiente on file.) and will not be requiring OPC follow-up after discharge.  The patient does not have transportation limitations that hinder transportation to clinic appointments.  Length of Stay: 2   McTyre, Emory R  09/30/2012, 11:41 AM

## 2012-09-30 NOTE — Progress Notes (Signed)
*  PRELIMINARY RESULTS* Vascular Ultrasound Lower extremity venous duplex has been completed.  Preliminary findings: Bilateral:  No evidence of DVT, superficial thrombosis, or Baker's Cyst.    Farrel Demark, RDMS, RVT  09/30/2012, 3:49 PM

## 2012-09-30 NOTE — Progress Notes (Signed)
Utilization Review Completed Auryn Paige J. Kileigh Ortmann, RN, BSN, NCM [phone]  

## 2012-09-30 NOTE — Progress Notes (Signed)
Janice Young - PROGRESS NOTE Resident Note   Please see below for attending addendum to resident note.  Subjective:   Pt with hematuria overnight, found to have started menses, continued chest pressure w/o EKG changes, no dysuria    Objective:    Vital Signs:   Temp:  [97.9 F (36.6 C)-98.1 F (36.7 C)] 98.1 F (36.7 C) (03/10 0504) Pulse Rate:  [104-117] 104 (03/10 0504) Resp:  [22-24] 24 (03/10 0504) BP: (158-201)/(99-114) 158/104 mmHg (03/10 0504) SpO2:  [100 %] 100 % (03/10 0504) Weight:  [280 lb 13.9 oz (127.4 kg)] 280 lb 13.9 oz (127.4 kg) (03/10 0504) Last BM Date: 09/27/12  24-hour weight change: Weight change: -7 lb 3.7 oz (-3.281 kg)  Weight trends: Filed Weights   09/28/12 1854 09/29/12 0523 09/30/12 0504  Weight: 287 lb 14.7 oz (130.6 kg) 285 lb 11.5 oz (129.6 kg) 280 lb 13.9 oz (127.4 kg)    Intake/Output:  03/09 0701 - 03/10 0700 In: 887.1 [P.O.:600; I.V.:237.1; IV Piggyback:50] Out: 2100 [Urine:2100]  Physical Exam: General: morbidly obese AA female, in no acute distress HEENT: Normocephalic, atraumatic, PERRLA, EOMI, No signs of anemia or jaundice, Moist mucous membranes, no purulence, normal turbinates, Oropharynx nonerythematous, no exudate appreciated, supple, no masses, no carotid Bruits, no JVD appreciated. Lungs: Normal respiratory effort. Clear to auscultation bilaterally from apices to bases without crackles or wheezes appreciated. Heart: normal rate, regular rhythm, normal S1 and S2, no gallop, murmur, or rubs appreciated. Abdomen: BS normoactive. Soft, Nondistended, non-tender. No masses or organomegaly appreciated. Extremities: 1-2+ pitting LE edema, distal pulses intact, large callus to right heel Neurologic: grossly non-focal    Labs: Basic Metabolic Panel:  Recent Labs Lab 09/28/12 1457 09/28/12 1833 09/29/12 0655 09/30/12 0508  NA 136  --  137 136  K 3.8  --  3.4* 3.8  CL 106  --  106 106  CO2 18*  --  18* 20   GLUCOSE 259*  --  116* 107*  BUN 23  --  24* 24*  CREATININE 1.77*  --  1.91* 2.12*  CALCIUM 8.5  --  8.4 8.2*  MG  --  2.0  --   --   PHOS  --  3.7  --  4.8*    Liver Function Tests:  Recent Labs Lab 09/28/12 1457 09/30/12 0508  AST 21  --   ALT 19  --   ALKPHOS 100  --   BILITOT 0.1*  --   PROT 5.9*  --   ALBUMIN 1.4* 1.3*    CBC:  Recent Labs Lab 09/28/12 0719  09/29/12 0655 09/29/12 2007 09/30/12 0508  WBC 16.2*  --  11.4* 12.6* 9.6  NEUTROABS 12.8*  --  7.6  --   --   HGB 10.6*  < > 10.1* 10.4* 9.0*  HCT 30.8*  < > 29.9* 30.7* 27.2*  MCV 81.5  --  81.5 81.9 82.9  PLT 433*  --  463* 474* 466*  < > = values in this interval not displayed.  Cardiac Enzymes:  Recent Labs Lab 09/29/12 0014 09/29/12 0655 09/29/12 2007  TROPONINI <0.30 <0.30 <0.30    CBG:  Recent Labs Lab 09/29/12 0555 09/29/12 1113 09/29/12 1702 09/29/12 2054 09/30/12 0558  GLUCAP 112* 117* 116* 153* 101*    Urinalysis:    Component Value Date/Time   COLORURINE YELLOW 09/29/2012 1210   APPEARANCEUR TURBID* 09/29/2012 1210   LABSPEC 1.010 09/29/2012 1210   PHURINE 7.0 09/29/2012 1210   GLUCOSEU 100*  09/29/2012 1210   HGBUR LARGE* 09/29/2012 1210   BILIRUBINUR NEGATIVE 09/29/2012 1210   KETONESUR NEGATIVE 09/29/2012 1210   PROTEINUR >300* 09/29/2012 1210   UROBILINOGEN 0.2 09/29/2012 1210   NITRITE NEGATIVE 09/29/2012 1210   LEUKOCYTESUR NEGATIVE 09/29/2012 1210     Imaging: Ct Head Wo Contrast  09/29/2012  *RADIOLOGY REPORT*  Clinical Data: Rule out hemorrhagic stroke.  Migraine symptoms.  CT HEAD WITHOUT CONTRAST  Technique:  Contiguous axial images were obtained from the base of the skull through the vertex without contrast.  Comparison: None  Findings: Ventricle size is normal.  Negative for acute or chronic infarct.  Negative for intracranial hemorrhage or mass lesion. Calvarium intact.  IMPRESSION: Negative   Original Report Authenticated By: Janeece Riggers, M.D.       Medications:     Infusions: . heparin 1,450 Units/hr (09/29/12 1144)    Scheduled Medications: . amLODipine  10 mg Oral Daily  . azithromycin  250 mg Oral Daily  . cefTRIAXone (ROCEPHIN)  IV  1 g Intravenous Q24H  . furosemide  80 mg Intravenous BID  . hydrALAZINE  25 mg Oral Q6H  . insulin aspart  0-20 Units Subcutaneous TID WC  . insulin glargine  30 Units Subcutaneous QHS  . isosorbide mononitrate  30 mg Oral Daily  . sodium chloride  3 mL Intravenous Q12H    PRN Medications: sodium chloride, acetaminophen, HYDROcodone-acetaminophen, levalbuterol, LORazepam, sodium chloride   Assessment/ Plan:    Pt is a 37 y.o. yo female with a PMHx of poorly controlled DM, Diabetic Nephropathy, likely diastolic CHF who was admitted on 09/28/2012 for shortness of breath. Renal service is consulted for nephrotic syndrome evaluation.  1. Diabetic Nephropathy Stage 4: pt with advanced diffuse and nodular diabetic glomerulosclerosis which appears stable from last admission at [facility] Nov 2013. Pt will need tight glycemic control and management of hypertension to reduce the progression although appears that compliance is major issue. Pt may benefit from reduced dietary salt intake (5-6 g/d), phosphorus and potassium restriction.  Plan:  -cont hold ACEi and diuresis with lasix with close serum creatinine monitoring  -monitor I/Os     Recent Labs Lab 09/28/12 0735 09/28/12 1457 09/29/12 0655 09/30/12 0508  CREATININE 1.70* 1.77* 1.91* 2.12*  2. Diastolic Congestive Heart Failure, exacerbation likely: on Lasix, metolazone held,  will likely need continued diuresis as tolerated by kidney function.  -cont carvedilol and amlodipine per primary team   3. Hypokalemia: resolved with supplementation, likely secondary to diuretic use  -cont to supplement potassium as needed   Recent Labs Lab 09/28/12 0735 09/28/12 1457 09/29/12 0655 09/30/12 0508  K 4.2 3.8 3.4* 3.8    4. Diabetes Mellitus,  uncontrolled: insulin dependent, previous HgbA1c >13% -HgbA1c pending  -management per primary team   CBG (last 3)   Recent Labs  09/29/12 1702 09/29/12 2054 09/30/12 0558  GLUCAP 116* 153* 101*    5. Hypertension: above goal with bp today 158/104 on amlodipine and prn hydralazine -recommend resume home regimen of carvedilol  -cont to hold lisinopril  6. Anxiety with Panic Attacks: currently on Ativan prn   Length of Stay: 2 days  Patient history and plan of care reviewed with attending, Dr. Briant Cedar.   Manuela Schwartz, MD  PGYII, Internal Medicine Resident 09/30/2012, 8:48 AM  I have personally seen and examined this patient and agree with the assessment/plan as outlined above by Bosie Clos MD (PGY2). Responding well to diuresis with anticipated creatinine rise with RAS activation.  Will continue to follow and transition over to PO diuretics in the next 24 hrs (Lasix 80mg  PO BID may be a good conversion dose). Encourage f/u with her primary nephrologist in [facility] K.,MD 09/30/2012 9:40 AM

## 2012-09-30 NOTE — Progress Notes (Signed)
Medical Student Daily Progress Note  Subjective: Today, [name] is doing better.  She states that her chest pressure is better at 5/10.  She denies any chest pain or headache.  The chest pressures is worse with cough.  She also endorses that her SOB is improving but not back to baseline.  She does describe one episode last night where she work up gasping for air, but has never been assessed for sleep apnea.    Yesterday, a rapid response was called for [name] for severe chest pain and blood in her urine, but it turned out she was starting her menses.  EKG done at that time showed no acute changes.  Objective: Vital signs in last 24 hours: Filed Vitals:   09/29/12 0949 09/29/12 1700 09/29/12 2149 09/30/12 0504  BP: 164/104 201/99 192/114 158/104  Pulse: 109 108 117 104  Temp:  97.9 F (36.6 C) 98.1 F (36.7 C) 98.1 F (36.7 C)  TempSrc:  Axillary Oral Oral  Resp:  22 24 24   Height:      Weight:    127.4 kg (280 lb 13.9 oz)  SpO2:  100% 100% 100%   Weight change: -3.281 kg (-7 lb 3.7 oz)  Intake/Output Summary (Last 24 hours) at 09/30/12 0857 Last data filed at 09/30/12 0029  Gross per 24 hour  Intake 527.12 ml  Output   1875 ml  Net -1347.88 ml   Physical Exam: General: resting in bed in NAD HEENT: PERRL, EOMI, no scleral icterus Cardiac: RRR, no rubs, murmurs or gallops Pulm: improved breath sounds and easier work of breathing Abd: soft, nontender, nondistended, BS present Ext: warm and well perfused, 1-2+ pretibial edema bilaterally Neuro: alert and oriented X3, cranial nerves II-XII grossly intact  Lab Results: CBC    Component Value Date/Time   WBC 9.6 09/30/2012 0508   RBC 3.28* 09/30/2012 0508   HGB 9.0* 09/30/2012 0508   HCT 27.2* 09/30/2012 0508   PLT 466* 09/30/2012 0508   MCV 82.9 09/30/2012 0508   MCH 27.4 09/30/2012 0508   MCHC 33.1 09/30/2012 0508   RDW 13.8 09/30/2012 0508   LYMPHSABS 1.8 09/29/2012 0655   MONOABS 1.0 09/29/2012 0655   EOSABS 1.0* 09/29/2012  0655   BASOSABS 0.0 09/29/2012 0655    CMP     Component Value Date/Time   NA 136 09/30/2012 0508   K 3.8 09/30/2012 0508   CL 106 09/30/2012 0508   CO2 20 09/30/2012 0508   GLUCOSE 107* 09/30/2012 0508   BUN 24* 09/30/2012 0508   CREATININE 2.12* 09/30/2012 0508   CALCIUM 8.2* 09/30/2012 0508   PROT 5.9* 09/28/2012 1457   ALBUMIN 1.3* 09/30/2012 0508   AST 21 09/28/2012 1457   ALT 19 09/28/2012 1457   ALKPHOS 100 09/28/2012 1457   BILITOT 0.1* 09/28/2012 1457   GFRNONAA 29* 09/30/2012 0508   GFRAA 33* 09/30/2012 0508    Heparin Unfractionated 0.29       09/30/2012 0508  U/A Results  as of 09/30/2012 09:01  Ref. Range 09/29/2012 12:10  Color, Urine Latest Range: YELLOW  YELLOW  APPearance Latest Range: CLEAR  TURBID (A)  Specific Gravity, Urine Latest Range: 1.005-1.030  1.010  pH Latest Range: 5.0-8.0  7.0  Glucose Latest Range: NEGATIVE mg/dL 147 (A)  Bilirubin Urine Latest Range: NEGATIVE  NEGATIVE  Ketones, ur Latest Range: NEGATIVE mg/dL NEGATIVE  Protein Latest Range: NEGATIVE mg/dL >829 (A)  Urobilinogen, UA Latest Range: 0.0-1.0 mg/dL 0.2  Nitrite Latest Range: NEGATIVE  NEGATIVE  Leukocytes, UA Latest Range: NEGATIVE  NEGATIVE  Hgb urine dipstick Latest Range: NEGATIVE  LARGE (A)  Urine-Other No range found AMORPHOUS URATES/PHOSPHATES  WBC, UA Latest Range: <3 WBC/hpf 0-2  RBC / HPF Latest Range: <3 RBC/hpf 3-6  Squamous Epithelial / LPF Latest Range: RARE  RARE  Bacteria, UA Latest Range: RARE  FEW (A)    Studies/Results: Ct Head Wo Contrast  09/29/2012  *RADIOLOGY REPORT*  Clinical Data: Rule out hemorrhagic stroke.  Migraine symptoms.  CT HEAD WITHOUT CONTRAST  Technique:  Contiguous axial images were obtained from the base of the skull through the vertex without contrast.  Comparison: None  Findings: Ventricle size is normal.  Negative for acute or chronic infarct.  Negative for intracranial hemorrhage or mass lesion. Calvarium intact.  IMPRESSION: Negative   Original Report  Authenticated By: Janeece Riggers, M.D.    2D Echocardiogram with Contrast  09/29/2012 Study Conclusions  - Left ventricle: The cavity size was normal. There was moderate concentric hypertrophy. Systolic function was normal. The estimated ejection fraction was in the range of 55% to 60%. Possible hypokinesis of the midinferoseptal myocardium. Doppler parameters are consistent with abnormal left ventricular relaxation (grade 1 diastolic dysfunction). - Right ventricle: The cavity size was mildly dilated. Wall thickness was normal. - Pericardium, extracardiac: A trivial pericardial effusion was identified circumferential to the heart. There was no evidence of hemodynamic compromise.   Medications: I have reviewed the patient's current medications. Scheduled Meds: . amLODipine  10 mg Oral Daily  . azithromycin  250 mg Oral Daily  . cefTRIAXone (ROCEPHIN)  IV  1 g Intravenous Q24H  . furosemide  80 mg Intravenous BID  . hydrALAZINE  25 mg Oral Q6H  . insulin aspart  0-20 Units Subcutaneous TID WC  . insulin glargine  30 Units Subcutaneous QHS  . isosorbide mononitrate  30 mg Oral Daily  . sodium chloride  3 mL Intravenous Q12H   Continuous Infusions: . heparin 1,450 Units/hr (09/29/12 1144)   PRN Meds:.sodium chloride, acetaminophen, HYDROcodone-acetaminophen, levalbuterol, LORazepam, sodium chloride  Assessment/Plan: [name] is a 37yo female with hx nephrotic syndrome, CHF, T2DM, and HTN that presents to the ED complaining of SOB for the past 5 days and a 2 day history of chest pressure that continues to persist. This morning she is improving with less SOB and chest pressure.   Principal Problems:    Shortness of breath  DDX: CHF exacerbation, CAP, MI, PE, panic attacks  Janice Young endorsed an 8lb weight gain over the past 3 days and increasing bilateral LLE and running out of her medications last week. Her proBNP was elevated at 6281 and serial troponins are WNL . She has been  afebrile without sick contacts; however there is leukocytosis (now likely thought to PE). Chest xray showed both pulmonary venous congestion and possible bilateral lobe bronchopneumonia. OSH records indicate her hospitalization in Nov 2013 showed an EF of 60-65%; echo here showed EF 60% with stage 1 diastolic dysfunction. Her admission to OSH in November 2013 was very similar to this presentation with LE swelling, elevated proBNP, leukocytosis and SOB, but chest xray did not show pulmonary edema. 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 Urine output is not as high as would be expected on IV lasix and pedal edema has not improved significantly overnight as would be expected in purely CHF exacerbation. While chest pain and RR might increase when she exhibits panic attacks; she still  has baseline pain/pressure and VS instability suggestive of another underlying etiology. At this time, it is felt her symptoms are mostly due to acute on chronic kidney failure and PE due to hypercoagulability from nephrotic syndrome.  - telemetry monitoring  - mag & phos returned WNL   CHF workup:  - cardiac echo from Nov 2013 showed EF 60-65% at OSH; echo here showed 60% EF with stage 1 diastolic fxn  - troponins neg x3  - diurese IV lasix 80 BID (home dose 40mg  oral BID per patient) --> consider conversion to lasix 80mg  PO BID over next 24 hrs -continue home isosorbide mononitrate -continue labetalol - strict I/O  -daily weights  -levalbuterol nebulizer q6hrs PRN for SOB   PE workup:  -ABG shows pO2 72.0 and pCO2 29.5; which makes shunt or V/Q mismatch likely  -A-a gradient on 100% O2 showed inconclusive result  -stop heparin and start coumadin & Lovenox for long-term anticoag   -creatinine increased today to 2.12; CTA could cause more kidney damage currently  -consider V/Q scan once consolidation/pulmonary edema resolves  - LE venous  duplex to r/o DVT still pending  CAP workup: pt afebrile and WBC trending down; SOB more likely due to fluid overload from PE/Nephrotic Syndrome/CHF -discontinue azithromycin and ceftriaxone    Active Problems:  Acute on chronic renal failure/Nephrotic Syndrome: creatinine 2.12 today; 1.7 on admission; 1.45 at OSH (Nov 2013), 0.9 prior to Nov 2013 admission. OSH records indicate nephrotic syndrome due to diabetes. Urine output is not as high as would be expected on IV lasix and pedal edema has not improved over 2 days. Elevated creatinine this morning is concerning for further renal decline.  - renal consulted. Appreciate their rec's. --U/A showed proteinuria, large Hbg (pt just started menses), glucose and few bacteria - potassium supplement - continue to diurese with lasix 80mg  2-3x/day - renal dopplers to r/o renal thrombosis due to nephrotic syndrome hypercoag - 24 hr urine protein collection d/c due to chronic nephrotic syndrome - stop lisinopril and metolazone due to elevated creatinine  Diabetes mellitus, type 2: per OSH records A1c >13.7 in Nov 2013  - check A1c  - SSI + 30 units lantus qHS  Hypertension: BP elevated on admission (150/103-171/99).  Continues to run high at times >190/100. -continue home amlodipine and labetalol -hydralazine 25mg  PO QID Headache: [name] new onset headache is well-controlled today on pain medication.   - stat head CT yesterday returned negative for ICH  - norco/vicodin PRN q6 hrs for pain Anemia: per patient required a blood transfusion in Nov 2013; currently on iron supplement at home  - CBC showed stable hemoglobin --> no acute bleed yesterday Anxiety with Panic Attacks:  -Ativan prn    LOS: 2 days   This is a Psychologist, occupational Note.  The care of the patient was discussed with Dr. Lavena Bullion and the assessment and plan formulated with their assistance.  Please see their attached note for official documentation of the daily  encounter.  Esau Grew 09/30/2012, 8:57 AM

## 2012-09-30 NOTE — Progress Notes (Signed)
ANTICOAGULATION CONSULT NOTE - Follow Up Consult  Pharmacy Consult for Lovenox >> Coumadin Indication: pulmonary embolus (presumed)  No Known Allergies  Patient Measurements: Height: 5\' 8"  (172.7 cm) Weight: 280 lb 13.9 oz (127.4 kg) IBW/kg (Calculated) : 63.9  Vital Signs: Temp: 98.1 F (36.7 C) (03/10 0504) Temp src: Oral (03/10 0504) BP: 141/83 mmHg (03/10 0929) Pulse Rate: 104 (03/10 0504)  Labs:  Recent Labs  09/28/12 1457  09/29/12 0014  09/29/12 0655 09/29/12 0829 09/29/12 1856 09/29/12 2007 09/30/12 0508  HGB  --   --   --   --  10.1*  --   --  10.4* 9.0*  HCT  --   --   --   --  29.9*  --   --  30.7* 27.2*  PLT  --   --   --   --  463*  --   --  474* 466*  HEPARINUNFRC  --   --   --   < > >2.00* 0.39 0.27*  --  0.29*  CREATININE 1.77*  --   --   --  1.91*  --   --   --  2.12*  TROPONINI <0.30  < > <0.30  --  <0.30  --   --  <0.30  --   < > = values in this interval not displayed.  Estimated Creatinine Clearance: 51.7 ml/min (by C-G formula based on Cr of 2.12).   Medications:  Scheduled:  . amLODipine  10 mg Oral Daily  . azithromycin  250 mg Oral Daily  . cefTRIAXone (ROCEPHIN)  IV  1 g Intravenous Q24H  . furosemide  80 mg Intravenous BID  . hydrALAZINE  25 mg Oral Q6H  . insulin aspart  0-20 Units Subcutaneous TID WC  . insulin glargine  30 Units Subcutaneous QHS  . isosorbide mononitrate  30 mg Oral Daily  . [COMPLETED] potassium chloride  40 mEq Oral Once  . sodium chloride  3 mL Intravenous Q12H  . [DISCONTINUED] furosemide  40 mg Intravenous BID  . [DISCONTINUED] metolazone  2.5 mg Oral Daily   Infusions:  . [DISCONTINUED] heparin 1,450 Units/hr (09/29/12 1144)    Assessment: 37 yo F admitted with SOB and chest pressure.  Pt was started on a heparin infusion for suspected PE.  Pt has been unable to complete studies to r/o PE due to ARF with SCR 2.12.  Pt has been on heparin for ~ 48 hours and has experienced one nosebleeds and hematuria  (possible related to menses).  Hgb is low but stable at 9.  PLTC wnl.  Plan today is to transition the patient to Lovenox and begin Coumadin for presumed PE until further studies can be done.  Goal of Therapy:  INR 2-3 Monitor platelets by anticoagulation protocol: Yes   Plan:  1. D/C heparin and labs. 2. Lovenox 130mg  SQ Q12h 3. Coumadin 10mg  PO x 1 tonight. 4. Daily INR. 5. Coumadin book/video.    [employer], Pharm.D., BCPS Clinical Pharmacist Pager [phone] 09/30/2012 10:51 AM

## 2012-09-30 NOTE — Progress Notes (Signed)
Internal Medicine Teaching Service Attending Note Date: 09/30/2012  Patient name: Janice Young  Medical record number: [id number]  Date of birth: May 18, 1976    I met with Janice Young and her parents today (mom + step dad). Janice Young said that she was doing much better specially since the time she has received anti-anxiety medications. She has some chest pain when she coughs, however does not have shortness of breath at this point. She is not wearing her oxygen any more.   Filed Vitals:   09/29/12 2149 09/30/12 0504 09/30/12 0929 09/30/12 1420  BP: 192/114 158/104 141/83 173/90  Pulse: 117 104  105  Temp: 98.1 F (36.7 C) 98.1 F (36.7 C)  98.7 F (37.1 C)  TempSrc: Oral Oral  Oral  Resp: 24 24  21   Height:      Weight:  280 lb 13.9 oz (127.4 kg)    SpO2: 100% 100%  99%   Vitals reviewed.  General: Resting in bed. Obese african [place] young female. No distress.  HEENT: PERRL, EOMI, no scleral icterus. No JVD seen.  Heart: S1S2 - sharp sounds, RRR, no rubs, murmurs or gallops. Lungs: Clear to auscultation bilaterally, no wheezes, rales, or rhonchi. Abdomen: Soft, nontender, nondistended, BS present. Extremities: Warm, ++ pittting pedal edema. Palpable pulses.  Neuro: Alert and oriented X3, cranial nerves II-XII grossly intact,  strength and sensation to light touch equal in bilateral upper and lower extremities   Medications while admitted . amLODipine  10 mg Oral Daily  . azithromycin  250 mg Oral Daily  . carvedilol  25 mg Oral BID WC  . cefTRIAXone (ROCEPHIN)  IV  1 g Intravenous Q24H  . enoxaparin (LOVENOX) injection  1 mg/kg Subcutaneous Q12H  . furosemide  80 mg Intravenous BID  . insulin aspart  0-20 Units Subcutaneous TID WC  . insulin glargine  30 Units Subcutaneous QHS  . isosorbide mononitrate  30 mg Oral Daily  . LORazepam  0.5 mg Oral Q6H  . sodium chloride  3 mL Intravenous Q12H  . warfarin  10 mg Oral ONCE-1800  . Warfarin - Pharmacist Dosing Inpatient   Does  not apply q1800      Recent Labs Lab 09/29/12 0655 09/29/12 2007 09/30/12 0508  HGB 10.1* 10.4* 9.0*  HCT 29.9* 30.7* 27.2*  WBC 11.4* 12.6* 9.6  PLT 463* 474* 466*    Recent Labs Lab 09/28/12 0735 09/28/12 1457 09/28/12 1833 09/29/12 0655 09/30/12 0508  NA 139 136  --  137 136  K 4.2 3.8  --  3.4* 3.8  CL 112 106  --  106 106  CO2  --  18*  --  18* 20  GLUCOSE 287* 259*  --  116* 107*  BUN 25* 23  --  24* 24*  CREATININE 1.70* 1.77*  --  1.91* 2.12*  CALCIUM  --  8.5  --  8.4 8.2*  MG  --   --  2.0  --   --   PHOS  --   --  3.7  --  4.8*   No results found for this basename: INR,  in the last 168 hours   Recent Labs Lab 09/28/12 1457 09/30/12 0508  AST 21  --   ALT 19  --   ALKPHOS 100  --   BILITOT 0.1*  --   PROT 5.9*  --   ALBUMIN 1.4* 1.3*    Recent Labs Lab 09/29/12 1113 09/29/12 1702 09/29/12 2054 09/30/12 0558 09/30/12 1133  GLUCAP 117* 116* 153* 101* 123*   1. Shortness of breath, likely due to compination of CHF, possible community acquired pneumonia and presumed PE.  2. Diabetes type 2 with Nephropathy, with nephrotic syndrome. 3. Hypertension 4. Anxiety   1. Shortness of breath - likely multifactorial.   PE - Seems a likely probability given NS. I agree with current treatment regimen.   CHF - ECHO shows EF 55-60% with grade 1 diastolic dysfunction and mildly dilated RV, with slight pericardial effusion with no evidence of hemodynamic compromise. EKG from 09/29/12 is Sinus tachy with 112 beats per minute, Q waves in III, normal PR and QT intervals, and no changes in ST segment that might suggest Acute MI.  Pneumonia - she has been continued on Azithromycin and Ceftriaxone for her presumed pneumonia, however, I think since she has had no fever, not much cough, and her WBC count is trending down, I will stop these antibiotics at this time.  2. Diabetes and nephropathy - Patient on insulin therapy for glycemic control. Holding ACEi at this  time in view of rapidly changing creatinine and Acute on chronic AKI.   Rest per resident note. Case discussed with team.   Dalphine Handing, [facility] 09/30/2012, 2:41 PM.

## 2012-10-01 ENCOUNTER — Inpatient Hospital Stay (HOSPITAL_COMMUNITY): Payer: Medicaid Other

## 2012-10-01 LAB — GLUCOSE, CAPILLARY
Glucose-Capillary: 107 mg/dL — ABNORMAL HIGH (ref 70–99)
Glucose-Capillary: 103 mg/dL — ABNORMAL HIGH (ref 70–99)
Glucose-Capillary: 106 mg/dL — ABNORMAL HIGH (ref 70–99)

## 2012-10-01 LAB — PROTIME-INR: Prothrombin Time: 13.9 seconds (ref 11.6–15.2)

## 2012-10-01 LAB — BASIC METABOLIC PANEL
BUN: 31 mg/dL — ABNORMAL HIGH (ref 6–23)
Chloride: 108 mEq/L (ref 96–112)
GFR calc Af Amer: 28 mL/min — ABNORMAL LOW (ref >90)
GFR calc non Af Amer: 24 mL/min — ABNORMAL LOW (ref >90)
Potassium: 4.1 mEq/L (ref 3.5–5.1)
Sodium: 138 mEq/L (ref 135–145)

## 2012-10-01 MED ORDER — FUROSEMIDE 80 MG PO TABS
80.0000 mg | ORAL_TABLET | Freq: Two times a day (BID) | ORAL | Status: DC
  Administered 2012-10-02: 80 mg via ORAL
  Filled 2012-10-01 (×3): qty 1

## 2012-10-01 MED ORDER — WARFARIN SODIUM 10 MG PO TABS
10.0000 mg | ORAL_TABLET | Freq: Once | ORAL | Status: AC
  Administered 2012-10-01: 10 mg via ORAL
  Filled 2012-10-01: qty 1

## 2012-10-01 MED ORDER — FUROSEMIDE 80 MG PO TABS: 80.0000 mg | ORAL_TABLET | Freq: Two times a day (BID) | ORAL | Status: DC

## 2012-10-01 NOTE — Progress Notes (Signed)
Internal Medicine Teaching Service Attending Note Date: 10/01/2012  Patient name: Janice Young  Medical record number: [id number]  Date of birth: 1975/08/12   Met the patient today. She offers no new complaints.   Filed Vitals:   10/01/12 0613 10/01/12 0800 10/01/12 0816 10/01/12 1007  BP: 142/77 135/83 121/69 121/80  Pulse: 95 88 89 87  Temp: 98.5 F (36.9 C) 97.7 F (36.5 C)  98.2 F (36.8 C)  TempSrc: Oral Oral  Oral  Resp: 18 18  18   Height:      Weight: 278 lb (126.1 kg)     SpO2: 98% 100%  98%    Exam:  General: no acute distress Heart: S1S2 normal, regular, no murmurs Lungs: Clear to auscultation Abdomen: benign Pedal edema ++   Recent Labs Lab 09/28/12 0735 09/28/12 1457 09/28/12 1833 09/29/12 0655 09/30/12 0508 10/01/12 0600  NA 139 136  --  137 136 138  K 4.2 3.8  --  3.4* 3.8 4.1  CL 112 106  --  106 106 108  CO2  --  18*  --  18* 20 21  GLUCOSE 287* 259*  --  116* 107* 126*  BUN 25* 23  --  24* 24* 31*  CREATININE 1.70* 1.77*  --  1.91* 2.12* 2.46*  CALCIUM  --  8.5  --  8.4 8.2* 8.3*  MG  --   --  2.0  --   --   --   PHOS  --   --  3.7  --  4.8*  --     Recent Labs Lab 09/29/12 0655 09/29/12 2007 09/30/12 0508  HGB 10.1* 10.4* 9.0*  HCT 29.9* 30.7* 27.2*  WBC 11.4* 12.6* 9.6  PLT 463* 474* 466*    Medications . amLODipine  10 mg Oral Daily  . carvedilol  25 mg Oral BID WC  . enoxaparin (LOVENOX) injection  1 mg/kg Subcutaneous Q12H  . [START ON 10/02/2012] furosemide  80 mg Oral BID  . insulin aspart  0-20 Units Subcutaneous TID WC  . insulin glargine  30 Units Subcutaneous QHS  . isosorbide mononitrate  30 mg Oral Daily  . LORazepam  0.5 mg Oral Q6H  . sodium chloride  3 mL Intravenous Q12H  . warfarin  10 mg Oral ONCE-1800  . Warfarin - Pharmacist Dosing Inpatient   Does not apply q1800   1. Presumed PE - Renal wants to evaluate with V/Q. Given an abnormal chest xray, we had ruled out that test, however, we are okay doing it,  since there is really no other way we have right now to be able to evaluate the presence of PE. Whether that test would be able to change management is a question. For now we will continue anticoagulation.  2. Worsening kidney function - Likely due to diuresis. Will follow per renal advice.   3. CHF - on lasix and metolazone.   Intake/Output Summary (Last 24 hours) at 10/01/12 1527 Last data filed at 10/01/12 1000  Gross per 24 hour  Intake   1163 ml  Output   2700 ml  Net  -1537 ml   4. DM2: controlled on SSI  Recent Labs Lab 09/30/12 1133 09/30/12 1610 09/30/12 2128 10/01/12 0626 10/01/12 1126  GLUCAP 123* 107* 163* 107* 103*    5. Rest per resident note. No further panic attacks. Care plan discussed with team.   Dalphine Handing, [facility] 10/01/2012, 2:58 PM.

## 2012-10-01 NOTE — Progress Notes (Signed)
ANTICOAGULATION CONSULT NOTE - Follow Up Consult  Pharmacy Consult for Lovenox >> Coumadin Indication: pulmonary embolus (presumed)  No Known Allergies  Patient Measurements: Height: 5\' 8"  (172.7 cm) Weight: 278 lb (126.1 kg) (scale A) IBW/kg (Calculated) : 63.9  Vital Signs: Temp: 97.7 F (36.5 C) (03/11 0800) Temp src: Oral (03/11 0800) BP: 121/69 mmHg (03/11 0816) Pulse Rate: 89 (03/11 0816)  Labs:  Recent Labs  09/29/12 0014  09/29/12 0655 09/29/12 0829 09/29/12 1856 09/29/12 2007 09/30/12 0508 10/01/12 0600  HGB  --   < > 10.1*  --   --  10.4* 9.0*  --   HCT  --   --  29.9*  --   --  30.7* 27.2*  --   PLT  --   --  463*  --   --  474* 466*  --   LABPROT  --   --   --   --   --   --   --  13.9  INR  --   --   --   --   --   --   --  1.08  HEPARINUNFRC  --   < > >2.00* 0.39 0.27*  --  0.29*  --   CREATININE  --   --  1.91*  --   --   --  2.12* 2.46*  TROPONINI <0.30  --  <0.30  --   --  <0.30  --   --   < > = values in this interval not displayed.  Estimated Creatinine Clearance: 44.3 ml/min (by C-G formula based on Cr of 2.46).   Medications:  Scheduled:  . amLODipine  10 mg Oral Daily  . carvedilol  25 mg Oral BID WC  . [COMPLETED] coumadin book   Does not apply Once  . enoxaparin (LOVENOX) injection  1 mg/kg Subcutaneous Q12H  . furosemide  80 mg Intravenous BID  . insulin aspart  0-20 Units Subcutaneous TID WC  . insulin glargine  30 Units Subcutaneous QHS  . isosorbide mononitrate  30 mg Oral Daily  . LORazepam  0.5 mg Oral Q6H  . sodium chloride  3 mL Intravenous Q12H  . [COMPLETED] warfarin  10 mg Oral ONCE-1800  . [COMPLETED] warfarin   Does not apply Once  . Warfarin - Pharmacist Dosing Inpatient   Does not apply q1800  . [DISCONTINUED] azithromycin  250 mg Oral Daily  . [DISCONTINUED] cefTRIAXone (ROCEPHIN)  IV  1 g Intravenous Q24H  . [DISCONTINUED] hydrALAZINE  25 mg Oral Q6H   Infusions:  . [DISCONTINUED] heparin 1,450 Units/hr (09/29/12  1144)    Assessment: 37 y/o female patient admitted with SOB and chest pressure.  Pt was started on a heparin infusion for suspected PE.  Pt has been unable to complete studies to r/o PE due to ARF with SCR 2.12.  Pt was on heparin x48 hours and had experienced nosebleeds and hematuria (possible related to menses).  Hgb is low but stable at 9.  PLTC wnl.  Transitioned to lovenox/coumadin yesterday. INR remains subtherapeutic, anticipate INR increase tomorrow. Day #2/5 minimum overlap. Should we get a VQ scan to confirm PE?  Goal of Therapy:  INR 2-3 Monitor platelets by anticoagulation protocol: Yes   Plan:  1. Continue Lovenox 130mg  SQ Q12h 2. Repeat Coumadin 10mg  PO x 1 tonight. 3. Daily INR.  Verlene Mayer, PharmD, BCPS Pager [phone] 10/01/2012 10:02 AM

## 2012-10-01 NOTE — Progress Notes (Signed)
Patient returned from x-ray and began to vomit. Patient complained of feeling dizzy, stating that as soon she went down in that wheelchair she felt sick. She stated that she also gets sick in car rides. Patient stated that every time she stands she gets dizzy. While lying at rest, she states that her eye sight is blurry. Patient has also complained of feeling very tired. BP is 137/88 and HR is 93. MD notified. Orders given. Will continue to monitor patient for further changes in condition.

## 2012-10-01 NOTE — Progress Notes (Signed)
Resident Addendum to Medical Student Note   I have seen and examined the patient, and agree with the the medical student assessment and plan outlined above. Please see my brief note below for additional details.  S: Feeling better today. Anxiety much improved after starting ativan and coreg. No complaints of CP/SOB this AM.    OBJECTIVE: VS: Reviewed  Meds: Reviewed  Labs: Reviewed  Imaging: Reviewed   Physical Exam: General: Vital signs reviewed and noted. Severely obese.  HEENT: Normocephalic, atraumatic  Lungs: Normal respiratory effort. Decreased breath sounds bilaterally.  Heart: RRR. S1 and S2 normal without gallop, murmur, or rubs.  Abdomen: BS normoactive. Soft, Nondistended, non-tender.  Extremities: 2+ pedal edema bilaterally.    ASSESSMENT/ PLAN: Patient is a 37 y.o. female with a PMHx of CHF, CKD, T2DM, HTN who was admitted on 09/28/2012 with symptoms of SOB which was determined to be secondary PE (vs CHF).  Acute pulmonary embolism - proBNP = 6281 and CXR revealing of vascular congestion. Records from [facility] regional from a hospitalization in 05/2012 revealed EF = 60-65%. Although on admission there was significant concern for CHF exacerbation given pt's reports of a CHF history, concern has now turned more towards pulmonary embolism as most likely etiology of the patient's presentation. PE would seem to be the most cohesive explanation for the patient's elevated proBNP, leukocytosis, relatively sudden onset of shortness of breath, and unusual CXR findings, particularly with a relatively normal 2d echo performed on 3/10. Pt also has a history of nephrotic syndrome, greatly increasing her risk of developing VTE. Unfortunately the patient's renal function is worsening, thus CTA cannot be safely pursued. V/Q would not be diagnostic in the setting of patient's underlying CXR abnormalities. Doppler LE negative for DVT.  - cont lovenox/coumadin bridge   Grade 1 Diastolic CHF -  Echo on 3/10 reveals EF = 55-60% with grade 1 diastolic dysfunction. Patient has diuresed a net -2.6L since admission, but her creatinine is rising (1.7=>=> 2.46), thus it appears she is likely not in CHF exacerbation, but her elevated proBNP is more likely 2/2 acute pulmonary of which there is very strong clinical suspicion.  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 Cr = 1.70 on admission but has trended upwards during her course, with Cr = 2.46 this AM. Likely 2/2 lasix.  - continue diuresis per nephro   Nephrotic syndrome - records from [facility] regional indicated pt has nephrotic syndrome. 24hr urine protein =14.6g. - management per nephrology  Type 2 diabetes mellitus - CBGs well-controlled on SSI and lantus 30U qhs.   Hypertension - BP remains elevated, although improved this AM. She was started on hydralazine PRN yesterday evening, however as her BP is improved and her home coreg is being restarted, we will d/c this at this time.  - cont lasix  - cont metolazone  - cont norvasc  - cont imdur  - start coreg 25mg  bid (home dose)   Anxiety/panic attacks - this appears to be a significant issue for this patient, although she does not take any psychiatric medications at home. Much improved with ativan and after initiation of home coreg (decreasing adrenergic activity). - ativan 0.5mg  PO q6h (scheduled as pt has significant anxiety and appears not to be asking for or receiving PRN  ativan)  - ativan PRN   DVT PPX - lovenox/coumadin  CODE STATUS - full  CONSULTS PLACED - Nephrology  The patient does have a current PCP (No primary provider on file.) and will not be requiring OPC follow-up after discharge.  The patient does not have transportation limitations that hinder transportation to  clinic appointments.   Length of Stay: 3   Janice Young R  10/01/2012, 8:48 AM

## 2012-10-01 NOTE — Progress Notes (Signed)
Resident Addendum to Medical Student Note   I have seen and examined the patient, and agree with the the medical student assessment and plan outlined above. Please see my brief note below for additional details.  S: Feeling better today. Anxiety much improved after starting ativan and coreg. No complaints of CP/SOB this AM.    OBJECTIVE: VS: Reviewed  Meds: Reviewed  Labs: Reviewed  Imaging: Reviewed   Physical Exam: General: Vital signs reviewed and noted. Severely obese.  HEENT: Normocephalic, atraumatic  Lungs: Normal respiratory effort. Decreased breath sounds bilaterally.  Heart: RRR. S1 and S2 normal without gallop, murmur, or rubs.  Abdomen: BS normoactive. Soft, Nondistended, non-tender.  Extremities: 2+ pedal edema bilaterally.    ASSESSMENT/ PLAN: Patient is a 37 y.o. female with a PMHx of CHF, CKD, T2DM, HTN who was admitted on 09/28/2012 with symptoms of SOB which was determined to be secondary PE (vs CHF).  Acute pulmonary embolism - proBNP = 6281 and CXR revealing of vascular congestion. Records from [facility] from a hospitalization in 05/2012 revealed EF = 60-65%. Although on admission there was significant concern for CHF exacerbation given pt's reports of a CHF history, concern has now turned more towards pulmonary embolism as most likely etiology of the patient's presentation. PE would seem to be the most cohesive explanation for the patient's elevated proBNP, leukocytosis, relatively sudden onset of shortness of breath, and unusual CXR findings, particularly with a relatively normal 2d echo performed on 3/10. Pt also has a history of nephrotic syndrome, greatly increasing her risk of developing VTE. Unfortunately the patient's renal function is worsening, thus CTA cannot be safely pursued. V/Q would not be diagnostic in the setting of patient's underlying CXR abnormalities. Doppler LE negative for DVT.  - cont lovenox/coumadin bridge   Grade 1 Diastolic CHF -  Echo on 3/10 reveals EF = 55-60% with grade 1 diastolic dysfunction. Patient has diuresed a net -2.6L since admission, but her creatinine is rising (1.7=>=> 2.46), thus it appears she is likely not in CHF exacerbation, but her elevated proBNP is more likely 2/2 acute pulmonary of which there is very strong clinical suspicion.  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 Cr = 1.70 on admission but has trended upwards during her course, with Cr = 2.46 this AM. Likely 2/2 lasix.  - continue diuresis per nephro   Nephrotic syndrome - records from [facility] regional indicated pt has nephrotic syndrome. 24hr urine protein =14.6g. - management per nephrology  Type 2 diabetes mellitus - CBGs well-controlled on SSI and lantus 30U qhs.   Hypertension - BP remains elevated, although improved this AM. She was started on hydralazine PRN yesterday evening, however as her BP is improved and her home coreg is being restarted, we will d/c this at this time.  - cont lasix  - cont metolazone  - cont norvasc  - cont imdur  - start coreg 25mg  bid (home dose)   Anxiety/panic attacks - this appears to be a significant issue for this patient, although she does not take any psychiatric medications at home. Much improved with ativan and after initiation of home coreg (decreasing adrenergic activity). - ativan 0.5mg  PO q6h (scheduled as pt has significant anxiety and appears not to be asking for or receiving PRN  ativan)  - ativan PRN   DVT PPX - lovenox/coumadin  CODE STATUS - full  CONSULTS PLACED - Nephrology  The patient does have a current PCP (No primary provider on file.) and will not be requiring OPC follow-up after discharge.  The patient does not have transportation limitations that hinder transportation to  clinic appointments.   Length of Stay: 3   McTyre, Emory R  10/01/2012, 8:48 AM  Addendum: discussed with team the option of v/q scan. Will discuss further tomorrow given repeat CXR is much improved today. Previously, v/q scan would have been highly unlikely to change management in this patient given her high pretest probability and very low likelihood of her having a normal or low prob v/q scan even in the absence of PE given her CXR findings on 3/8.  Even with a normal CXR on 3/11, it is unclear if a v/q scan will be revealing of PE at this time since the patient was admitted and started on anticoagulation >72hrs ago. Pending discussion of utility of v/q scan tomorrow, pt will continue on anticoagulation based on high clinical suspicion as supported by the following: 1)  presentation with relatively sudden onset of CP/SOB, tachycardia and elevated proBNP in the absence of significant systolic or diastolic heart failure (only grade 1 diastolic dysfunction, which would be unlikely to account for elevated proBNP (>6000) which is more common in systolic HF and in severe diastolic HF (despite decreased GFR)) 2) RA ABG with hypoxemia, hypocapnia, and an Aa gradient of 40. 3) EKG with q-waves in inferior leads. 4) RV dilatation and inferior septal hypokinesis on 2d echo. 5) severe obesity and underlying nephrotic syndrome with proteinuria ~15g/24hr and albumin = 1.3 (1.5 on admission).

## 2012-10-01 NOTE — Progress Notes (Signed)
[facility] - PROGRESS NOTE Resident Note   Please see below for attending addendum to resident note.  Subjective:   Nausea and emesis yesterday afternoon, no compliants this morning, mother at bedside    Objective:    Vital Signs:   Temp:  [97.7 F (36.5 C)-98.7 F (37.1 C)] 97.7 F (36.5 C) (03/11 0800) Pulse Rate:  [88-105] 89 (03/11 0816) Resp:  [18-21] 18 (03/11 0613) BP: (118-173)/(58-90) 121/69 mmHg (03/11 0816) SpO2:  [98 %-100 %] 100 % (03/11 0800) Weight:  [278 lb (126.1 kg)] 278 lb (126.1 kg) (03/11 0613) Last BM Date: 10/01/12  24-hour weight change: Weight change: -2 lb 13.9 oz (-1.3 kg)  Weight trends: Filed Weights   09/29/12 0523 09/30/12 0504 10/01/12 0613  Weight: 285 lb 11.5 oz (129.6 kg) 280 lb 13.9 oz (127.4 kg) 278 lb (126.1 kg)    Intake/Output:  03/10 0701 - 03/11 0700 In: 1405 [P.O.:1402; I.V.:3] Out: 2450 [Urine:2450]  Physical Exam: General: morbidly obese AA female, in no acute distress HEENT: Normocephalic, atraumatic, PERRLA, EOMI, No signs of anemia or jaundice, Moist mucous membranes, no purulence, normal turbinates, Oropharynx nonerythematous, no exudate appreciated, supple, no masses, no carotid Bruits, no JVD appreciated. Lungs: Normal respiratory effort. Clear to auscultation bilaterally from apices to bases without crackles or wheezes appreciated. Heart: normal rate, regular rhythm, normal S1 and S2, no gallop, murmur, or rubs appreciated. Abdomen: BS normoactive. Soft, Nondistended, non-tender. No masses or organomegaly appreciated. Extremities: trace LE edema, distal pulses intact, large callus to right heel Neurologic: grossly non-focal, A&O x3    Labs: Basic Metabolic Panel:  Recent Labs Lab 09/28/12 1457 09/28/12 1833 09/29/12 0655 09/30/12 0508 10/01/12 0600  NA 136  --  137 136 138  K 3.8  --  3.4* 3.8 4.1  CL 106  --  106 106 108  CO2 18*  --  18* 20 21  GLUCOSE 259*  --  116* 107* 126*  BUN  23  --  24* 24* 31*  CREATININE 1.77*  --  1.91* 2.12* 2.46*  CALCIUM 8.5  --  8.4 8.2* 8.3*  MG  --  2.0  --   --   --   PHOS  --  3.7  --  4.8*  --     Liver Function Tests:  Recent Labs Lab 09/28/12 1457 09/30/12 0508  AST 21  --   ALT 19  --   ALKPHOS 100  --   BILITOT 0.1*  --   PROT 5.9*  --   ALBUMIN 1.4* 1.3*    CBC:  Recent Labs Lab 09/28/12 0719  09/29/12 0655 09/29/12 2007 09/30/12 0508  WBC 16.2*  --  11.4* 12.6* 9.6  NEUTROABS 12.8*  --  7.6  --   --   HGB 10.6*  < > 10.1* 10.4* 9.0*  HCT 30.8*  < > 29.9* 30.7* 27.2*  MCV 81.5  --  81.5 81.9 82.9  PLT 433*  --  463* 474* 466*  < > = values in this interval not displayed.  Cardiac Enzymes:  Recent Labs Lab 09/29/12 0014 09/29/12 0655 09/29/12 2007  TROPONINI <0.30 <0.30 <0.30    CBG:  Recent Labs Lab 09/30/12 0558 09/30/12 1133 09/30/12 1610 09/30/12 2128 10/01/12 0626  GLUCAP 101* 123* 107* 163* 107*    Urinalysis:    Component Value Date/Time   COLORURINE YELLOW 09/29/2012 1210   APPEARANCEUR TURBID* 09/29/2012 1210   LABSPEC 1.010 09/29/2012 1210   PHURINE 7.0 09/29/2012  1210   GLUCOSEU 100* 09/29/2012 1210   HGBUR LARGE* 09/29/2012 1210   BILIRUBINUR NEGATIVE 09/29/2012 1210   KETONESUR NEGATIVE 09/29/2012 1210   PROTEINUR >300* 09/29/2012 1210   UROBILINOGEN 0.2 09/29/2012 1210   NITRITE NEGATIVE 09/29/2012 1210   LEUKOCYTESUR NEGATIVE 09/29/2012 1210     Imaging: Ct Head Wo Contrast  09/29/2012  *RADIOLOGY REPORT*  Clinical Data: Rule out hemorrhagic stroke.  Migraine symptoms.  CT HEAD WITHOUT CONTRAST  Technique:  Contiguous axial images were obtained from the base of the skull through the vertex without contrast.  Comparison: None  Findings: Ventricle size is normal.  Negative for acute or chronic infarct.  Negative for intracranial hemorrhage or mass lesion. Calvarium intact.  IMPRESSION: Negative   Original Report Authenticated By: Janeece Riggers, M.D.       Medications:    Infusions:     Scheduled Medications: . amLODipine  10 mg Oral Daily  . carvedilol  25 mg Oral BID WC  . enoxaparin (LOVENOX) injection  1 mg/kg Subcutaneous Q12H  . furosemide  80 mg Intravenous BID  . insulin aspart  0-20 Units Subcutaneous TID WC  . insulin glargine  30 Units Subcutaneous QHS  . isosorbide mononitrate  30 mg Oral Daily  . LORazepam  0.5 mg Oral Q6H  . sodium chloride  3 mL Intravenous Q12H  . Warfarin - Pharmacist Dosing Inpatient   Does not apply q1800    PRN Medications: sodium chloride, acetaminophen, HYDROcodone-acetaminophen, levalbuterol, LORazepam, ondansetron, ondansetron, sodium chloride   Assessment/ Plan:    Pt is a 37 y.o. yo female with a PMHx of poorly controlled DM, Diabetic Nephropathy, likely diastolic CHF who was admitted on 09/28/2012 for shortness of breath. Renal service is consulted for nephrotic syndrome evaluation.  1. Diabetic Nephropathy Stage 4: creatinine 2.12 yesterday-->2.46 today, no IVF fluids currently, pt UOP 2.4L over past day with net -1L on Lasix 80 mg IV Plan:  -cont hold ACEi  -change to oral lasix 80 mg bid with close serum creatinine monitoring  -monitor I/Os     Recent Labs Lab 09/28/12 0735 09/28/12 1457 09/29/12 0655 09/30/12 0508 10/01/12 0600  CREATININE 1.70* 1.77* 1.91* 2.12* 2.46*   2. Diastolic Congestive Heart Failure, exacerbation likely: on Lasix, metolazone held -cont carvedilol and amlodipine per primary team   3. Pulmonary Embolism: pt on Lovenox with Warfarin bridge -anticoag per pharmacy  4. Hypokalemia: resolved with supplementation, likely secondary to diuretic use  -cont to supplement potassium as needed   Recent Labs Lab 09/28/12 0735 09/28/12 1457 09/29/12 0655 09/30/12 0508 10/01/12 0600  K 4.2 3.8 3.4* 3.8 4.1    5. Diabetes Mellitus, uncontrolled: insulin dependent, previous HgbA1c 8.3% -management per primary team   CBG (last 3)   Recent Labs  09/30/12 1610 09/30/12 2128  10/01/12 0626  GLUCAP 107* 163* 107*    6. Hypertension: above goal with bp today on amlodipine, imdur, carvedilol and lasix -cont to hold lisinopril  7. Anxiety with Panic Attacks: currently on Ativan prn   Length of Stay: 3 days  Patient history and plan of care reviewed with attending, Dr. Allena Katz.   Manuela Schwartz, MD  PGYII, Internal Medicine Resident 10/01/2012, 8:44 AM  I have personally seen and examined this patient and agree with the assessment/plan as outlined above by Bosie Clos MD (PGY2). Renal function slightly worse (possibly unmasking of baseline with diuresis)- recommend switch to PO furosemide 80mg  BID in 24hrs from 80 IV BID. Evaluate further for PE with V/Q  PATEL,JAY K.,MD 10/01/2012 9:55 AM

## 2012-10-01 NOTE — Progress Notes (Signed)
Medical Student Daily Progress Note  Subjective: Today, Janice Young continues to improve.  She denies any chest pain, pressure or SOB today.  She feels less anxious today.  NAEO.  Objective: Vital signs in last 24 hours: Filed Vitals:   09/30/12 1726 09/30/12 2133 10/01/12 0613 10/01/12 0816  BP: 139/82 118/58 142/77 121/69  Pulse: 104 93 95 89  Temp:  98.1 F (36.7 C) 98.5 F (36.9 C)   TempSrc:  Oral Oral   Resp: 20 18 18    Height:      Weight:   126.1 kg (278 lb)   SpO2: 99% 100% 98%    Weight change: -1.3 kg (-2 lb 13.9 oz)  Intake/Output Summary (Last 24 hours) at 10/01/12 0820 Last data filed at 10/01/12 0816  Gross per 24 hour  Intake   1408 ml  Output   2450 ml  Net  -1042 ml   Physical Exam: General: resting in bed in NAD HEENT: PERRL, EOMI, no scleral icterus Cardiac: RRR, no rubs, murmurs or gallops Pulm: improved breath sounds and easier work of breathing Abd: obese, soft, nontender, nondistended, BS present Ext: warm and well perfused, 1+ pretibial edema bilaterally Neuro: alert and oriented X3, cranial nerves II-XII grossly intact  Lab Results: CBC    Component Value Date/Time   WBC 9.6 09/30/2012 0508   RBC 3.28* 09/30/2012 0508   HGB 9.0* 09/30/2012 0508   HCT 27.2* 09/30/2012 0508   PLT 466* 09/30/2012 0508   MCV 82.9 09/30/2012 0508   MCH 27.4 09/30/2012 0508   MCHC 33.1 09/30/2012 0508   RDW 13.8 09/30/2012 0508   LYMPHSABS 1.8 09/29/2012 0655   MONOABS 1.0 09/29/2012 0655   EOSABS 1.0* 09/29/2012 0655   BASOSABS 0.0 09/29/2012 0655    CMP     Component Value Date/Time   NA 138 10/01/2012 0600   K 4.1 10/01/2012 0600   CL 108 10/01/2012 0600   CO2 21 10/01/2012 0600   GLUCOSE 126* 10/01/2012 0600   BUN 31* 10/01/2012 0600   CREATININE 2.46* 10/01/2012 0600   CALCIUM 8.3* 10/01/2012 0600   PROT 5.9* 09/28/2012 1457   ALBUMIN 1.3* 09/30/2012 0508   AST 21 09/28/2012 1457   ALT 19 09/28/2012 1457   ALKPHOS 100 09/28/2012 1457   BILITOT 0.1* 09/28/2012 1457   GFRNONAA 24* 10/01/2012 0600   GFRAA 28* 10/01/2012 0600   INR: 1.08   10/01/2012 0600  Studies/Results: Ct Head Wo Contrast  09/29/2012  *RADIOLOGY REPORT*  Clinical Data: Rule out hemorrhagic stroke.  Migraine symptoms.  CT HEAD WITHOUT CONTRAST  Technique:  Contiguous axial images were obtained from the base of the skull through the vertex without contrast.  Comparison: None  Findings: Ventricle size is normal.  Negative for acute or chronic infarct.  Negative for intracranial hemorrhage or mass lesion. Calvarium intact.  IMPRESSION: Negative   Original Report Authenticated By: Janeece Riggers, M.D.    2D Echocardiogram with Contrast  09/29/2012 Study Conclusions  - Left ventricle: The cavity size was normal. There was moderate concentric hypertrophy. Systolic function was normal. The estimated ejection fraction was in the range of 55% to 60%. Possible hypokinesis of the midinferoseptal myocardium. Doppler parameters are consistent with abnormal left ventricular relaxation (grade 1 diastolic dysfunction). - Right ventricle: The cavity size was mildly dilated. Wallthickness was normal. - Pericardium, extracardiac: A trivial pericardial effusion was identified circumferential to the heart. There was no evidence of hemodynamic compromise.     Medications: I have reviewed the  patient's current medications. Scheduled Meds: . amLODipine  10 mg Oral Daily  . carvedilol  25 mg Oral BID WC  . enoxaparin (LOVENOX) injection  1 mg/kg Subcutaneous Q12H  . furosemide  80 mg Intravenous BID  . insulin aspart  0-20 Units Subcutaneous TID WC  . insulin glargine  30 Units Subcutaneous QHS  . isosorbide mononitrate  30 mg Oral Daily  . LORazepam  0.5 mg Oral Q6H  . sodium chloride  3 mL Intravenous Q12H  . Warfarin - Pharmacist Dosing Inpatient   Does not apply q1800   Continuous Infusions:   PRN Meds:.sodium chloride, acetaminophen, HYDROcodone-acetaminophen, levalbuterol, LORazepam, ondansetron,  ondansetron, sodium chloride  Assessment/Plan: Janice Young is a 37yo female with hx nephrotic syndrome, CHF, T2DM, and HTN that presented to the ED complaining of SOB for the past 5 days and a 2 day history of chest pressure that continues to persist. This morning she is improving, denying any SOB and chest pressure.   Principal Problems:  Shortness of breath  DDX: CHF exacerbation, CAP, MI, PE, panic attacks  At this time, it is felt that her symptoms are mostly due to PE and nephrotic syndrome.  Her echo showed EF of 60% with stage 1 diastolic dysfunction.  A CT chest could not be done due to rising creatinine (2.46 today) and V/Q scan would not be reliable given acute bilateral venous congestion and possible bronchopneumonia.  Her weight is down 10lbs from admission weight.  With strong clinical suspicion of PE, we are treating her empirically with anticoagulation.  - telemetry monitoring  - mag & phos returned WNL   CHF workup:  - cardiac echo from Nov 2013 showed EF 60-65% at OSH; echo here showed 60% EF with stage 1 diastolic fxn  - troponins neg x3  - switch to PO lasix 80 BID (home dose 40mg  oral BID per patient) -continue home isosorbide mononitrate -continue home carvedilol - strict I/O  -daily weights  -levalbuterol nebulizer q6hrs PRN for SOB   PE workup:  -ABG shows pO2 72.0 and pCO2 29.5; which makes shunt or V/Q mismatch likely  -A-a gradient on 100% O2 showed inconclusive result  -stop heparin and start coumadin & bridging lovenox for long-term anticoag  (INR 1 today) -creatinine increased today to 2.46; CTA could cause more kidney damage currently  -consider V/Q scan once consolidation/pulmonary edema resolves if necessary - LE venous duplex preliminary report showed no DVT  CAP workup: pt afebrile and WBC trending down; SOB more likely due to fluid overload from PE/Nephrotic Syndrome/CHF -discontinue azithromycin and ceftriaxone    Active Problems:  Acute on chronic  renal failure/Nephrotic Syndrome: creatinine 2.46 today; 1.7 on admission; 1.45 at OSH (Nov 2013), 0.9 prior to Nov 2013 admission. OSH records indicate nephrotic syndrome due to diabetes. Elevated creatinine this morning is concerning for further renal decline.  - renal consulted. Appreciate their rec's. -U/A showed proteinuria, large Hbg (pt just started menses), glucose and few bacteria - potassium supplement - switch to PO lasix (80mg  PO) 2x/day - 24 hr urine protein collection showed 14644 mg/day  - stop lisinopril and metolazone due to elevated creatinine   Diabetes mellitus, type 2: per OSH records A1c >13.7 in Nov 2013  - Al1 here 8.3 - SSI + 30 units lantus qHS  Hypertension: BP elevated on admission (150/103-171/99).  BP starting to come down. highest yesterday was 173/90; 121/69 this morning -continue home amlodipine and carvedilol  Headache: denies h/a today   - stat  head CT yesterday returned negative for ICH  - norco/vicodin PRN q6 hrs for pain Anemia: per patient required a blood transfusion in Nov 2013; currently on iron supplement at home  - CBC showed stable hemoglobin --> no acute bleed yesterday Anxiety with Panic Attacks:  -Ativan scheduled q4hrs    LOS: 3 days   This is a Psychologist, occupational Note.  The care of the patient was discussed with Dr. Lavena Bullion and the assessment and plan formulated with their assistance.  Please see their attached note for official documentation of the daily encounter.  Esau Grew 10/01/2012, 8:20 AM

## 2012-10-02 ENCOUNTER — Encounter (HOSPITAL_COMMUNITY): Payer: Self-pay | Admitting: Internal Medicine

## 2012-10-02 LAB — BASIC METABOLIC PANEL
BUN: 35 mg/dL — ABNORMAL HIGH (ref 6–23)
CO2: 23 mEq/L (ref 19–32)
Chloride: 107 mEq/L (ref 96–112)
Creatinine, Ser: 2.38 mg/dL — ABNORMAL HIGH (ref 0.50–1.10)
GFR calc Af Amer: 29 mL/min — ABNORMAL LOW (ref >90)
Glucose, Bld: 128 mg/dL — ABNORMAL HIGH (ref 70–99)
Potassium: 3.5 mEq/L (ref 3.5–5.1)

## 2012-10-02 LAB — GLUCOSE, CAPILLARY: Glucose-Capillary: 136 mg/dL — ABNORMAL HIGH (ref 70–99)

## 2012-10-02 MED ORDER — FUROSEMIDE 80 MG PO TABS: 80.0000 mg | ORAL_TABLET | Freq: Two times a day (BID) | ORAL | Status: AC

## 2012-10-02 MED ORDER — ONDANSETRON HCL 4 MG PO TABS: 4.0000 mg | ORAL_TABLET | Freq: Three times a day (TID) | ORAL | Status: AC | PRN

## 2012-10-02 MED ORDER — ENOXAPARIN SODIUM 150 MG/ML ~~LOC~~ SOLN: 1.0000 mg/kg | Freq: Two times a day (BID) | SUBCUTANEOUS | Status: DC

## 2012-10-02 MED ORDER — WARFARIN SODIUM 5 MG PO TABS: ORAL_TABLET | ORAL | Status: AC

## 2012-10-02 MED ORDER — HYDROCODONE-ACETAMINOPHEN 5-325 MG PO TABS: 1.0000 | ORAL_TABLET | Freq: Four times a day (QID) | ORAL | Status: AC | PRN

## 2012-10-02 MED ORDER — LORAZEPAM 0.5 MG PO TABS: 0.5000 mg | ORAL_TABLET | Freq: Four times a day (QID) | ORAL | Status: AC | PRN

## 2012-10-02 MED ORDER — WARFARIN SODIUM 7.5 MG PO TABS
15.0000 mg | ORAL_TABLET | Freq: Once | ORAL | Status: DC
  Filled 2012-10-02: qty 2

## 2012-10-02 MED ORDER — ENOXAPARIN (LOVENOX) PATIENT EDUCATION KIT
PACK | Freq: Once | Status: AC
  Administered 2012-10-02: 12:00:00
  Filled 2012-10-02: qty 1

## 2012-10-02 MED ORDER — AMLODIPINE BESYLATE 10 MG PO TABS: 10.0000 mg | ORAL_TABLET | Freq: Every day | ORAL | Status: AC

## 2012-10-02 NOTE — Progress Notes (Addendum)
Subjective:    Patient states she feels well this AM. One episode of nausea/vomiting yesterday due to taking norco on an empty stomach, per her report.   Interval Events: Pt had a repeat CXR performed yesterday which revealed significant improvement.    Objective:    Vital Signs:   Temp:  [98 F (36.7 C)-98.5 F (36.9 C)] 98.5 F (36.9 C) (03/12 0740) Pulse Rate:  [87-105] 88 (03/12 0740) Resp:  [18-20] 19 (03/12 0740) BP: (118-143)/(74-89) 122/74 mmHg (03/12 0740) SpO2:  [96 %-98 %] 96 % (03/12 0700) Weight:  [275 lb 5.7 oz (124.9 kg)] 275 lb 5.7 oz (124.9 kg) (03/12 0700) Last BM Date: 10/01/12  24-hour weight change: Weight change: -2 lb 10.3 oz (-1.2 kg)  Intake/Output:   Intake/Output Summary (Last 24 hours) at 10/02/12 0849 Last data filed at 10/02/12 0700  Gross per 24 hour  Intake    440 ml  Output   2025 ml  Net  -1585 ml      Physical Exam: General: Vital signs reviewed and noted.  HEENT: Normocephalic, atraumatic  Lungs: Normal respiratory effort. Decreased breath sounds bilaterally.  Heart: RRR. S1 and S2 normal without gallop, murmur, or rubs.  Abdomen: BS normoactive. Soft, Nondistended, non-tender.  Extremities: 2+ pedal edema bilaterally.    Labs:  Basic Metabolic Panel:  Recent Labs Lab 09/28/12 1457 09/28/12 1833 09/29/12 0655 09/30/12 0508 10/01/12 0600 10/02/12 0505  NA 136  --  137 136 138 139  K 3.8  --  3.4* 3.8 4.1 3.5  CL 106  --  106 106 108 107  CO2 18*  --  18* 20 21 23   GLUCOSE 259*  --  116* 107* 126* 128*  BUN 23  --  24* 24* 31* 35*  CREATININE 1.77*  --  1.91* 2.12* 2.46* 2.38*  CALCIUM 8.5  --  8.4 8.2* 8.3* 8.0*  MG  --  2.0  --   --   --   --   PHOS  --  3.7  --  4.8*  --   --     Liver Function Tests:  Recent Labs Lab 09/28/12 1457 09/30/12 0508  AST 21  --   ALT 19  --   ALKPHOS 100  --   BILITOT 0.1*  --   PROT 5.9*  --   ALBUMIN 1.4* 1.3*   CBC:  Recent Labs Lab 09/28/12 0719  09/28/12 0735 09/29/12 0655 09/29/12 2007 09/30/12 0508  WBC 16.2*  --  11.4* 12.6* 9.6  NEUTROABS 12.8*  --  7.6  --   --   HGB 10.6* 9.5* 10.1* 10.4* 9.0*  HCT 30.8* 28.0* 29.9* 30.7* 27.2*  MCV 81.5  --  81.5 81.9 82.9  PLT 433*  --  463* 474* 466*    Cardiac Enzymes:  Recent Labs Lab 09/28/12 1457 09/28/12 1833 09/29/12 0014 09/29/12 0655 09/29/12 2007  TROPONINI <0.30 <0.30 <0.30 <0.30 <0.30   CBG:  Recent Labs Lab 10/01/12 0626 10/01/12 1126 10/01/12 1642 10/01/12 2114 10/02/12 0601  GLUCAP 107* 103* 143* 106* 118*    Microbiology: Results for orders placed during the hospital encounter of 09/28/12  CULTURE, BLOOD (ROUTINE X 2)     Status: None   Collection Time    09/28/12  6:21 PM      Result Value Range Status   Specimen Description BLOOD LEFT ARM   Final   Special Requests BOTTLES DRAWN AEROBIC ONLY 3 ML   Final  Culture  Setup Time 09/29/2012 00:11   Final   Culture     Final   Value:        BLOOD CULTURE RECEIVED NO GROWTH TO DATE CULTURE WILL BE HELD FOR 5 DAYS BEFORE ISSUING A FINAL NEGATIVE REPORT   Report Status PENDING   Incomplete  CULTURE, BLOOD (ROUTINE X 2)     Status: None   Collection Time    09/28/12  6:41 PM      Result Value Range Status   Specimen Description BLOOD RIGHT HAND   Final   Special Requests BOTTLES DRAWN AEROBIC ONLY 3 ML   Final   Culture  Setup Time 09/29/2012 00:11   Final   Culture     Final   Value:        BLOOD CULTURE RECEIVED NO GROWTH TO DATE CULTURE WILL BE HELD FOR 5 DAYS BEFORE ISSUING A FINAL NEGATIVE REPORT   Report Status PENDING   Incomplete    Coagulation Studies:  Recent Labs  10/01/12 0600 10/02/12 0505  LABPROT 13.9 14.4  INR 1.08 1.14   Imaging: Dg Chest 2 View  10/01/2012  *RADIOLOGY REPORT*  Clinical Data: Fluid overload, history renal failure, hypertension, diabetes  CHEST - 2 VIEW  Comparison: 09/28/2012  Findings: Borderline enlargement of cardiac silhouette. Minimal pulmonary  vascular congestion. Mediastinal contours normal. Lungs clear. No pleural effusion or pneumothorax. Bones unremarkable.  IMPRESSION: Borderline enlargement of cardiac silhouette with normal pulmonary vascular congestion. No acute abnormalities.   Original Report Authenticated By: Ulyses Southward, M.D.        Medications:    Infusions:    Scheduled Medications: . amLODipine  10 mg Oral Daily  . carvedilol  25 mg Oral BID WC  . enoxaparin (LOVENOX) injection  1 mg/kg Subcutaneous Q12H  . furosemide  80 mg Oral BID  . insulin aspart  0-20 Units Subcutaneous TID WC  . insulin glargine  30 Units Subcutaneous QHS  . isosorbide mononitrate  30 mg Oral Daily  . LORazepam  0.5 mg Oral Q6H  . sodium chloride  3 mL Intravenous Q12H  . Warfarin - Pharmacist Dosing Inpatient   Does not apply q1800    PRN Medications: sodium chloride, acetaminophen, HYDROcodone-acetaminophen, levalbuterol, LORazepam, ondansetron, ondansetron, sodium chloride   Assessment/ Plan:   Patient is a 37 y.o. female with a PMHx of CHF, CKD, T2DM, HTN who was admitted on 09/28/2012 with symptoms of SOB which was determined to be secondary PE (vs CHF).   Acute pulmonary embolism - diagnosed clinically based on the following, as CTA and v/q were not viable options on admission:  presentation with relatively sudden onset of R-sided CP with SOB, tachycardia and elevated proBNP in the absence of significant systolic or diastolic heart failure (only grade 1 diastolic dysfunction, which would be unlikely to account for elevated proBNP (>6000) which is more common in systolic HF and in severe diastolic HF (despite decreased GFR))   RA ABG with hypoxemia, hypocapnia, and an Aa gradient of 40.   EKG with q-waves in inferior leads suggestive of RV strain  RV dilatation and inferior septal hypokinesis on 2d echo.   severe obesity and underlying nephrotic syndrome with proteinuria ~15g/24hr and albumin = 1.3 (1.5 on  admission).  Despite improved CXR on 3/11, v/q still not likely a useful diagnostic modality as it is unclear how sensitive this study would be in the setting of anticoagulation for almost 96hr. Previously, v/q scan would have been highly unlikely to  change management in this patient given her high pretest probability and very low likelihood of her having a normal or low prob v/q scan even in the absence of PE given her CXR findings on 3/8.   Anticoagulation will be continued at this time for treatment of pulmonary embolism. Additionally, the issue of prophylactic anticoagulation was discussed with the patient/family and her friend (who is a Leisure centre manager), and they would want to pursue this option even in the absence of PE after being educated on the risk/benefit. Of note, the patient did present after running out of her medications x1 wk, but extensive discussions with the patient indicate that she is responsible regarding her healthcare overall, and would follow-up with a Coumadin clinic and take warfarin appropriately.  Plan: - continue lovenox/warfarin bridge - f/u with PCP and associated coumadin clinic after discharge (appt on 3/17)  Nephrotic syndrome - records from [facility] indicated pt has nephrotic syndrome. 24hr urine protein =14.6g. Albumin = 1.3.  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 Modest nterval improvement in Cr 2.46=>2.38 after lasix was transitioned from IV to PO yesterday. - management per nephrology  Grade 1 Diastolic CHF - Echo on 3/10 reveals EF = 55-60% with grade 1 diastolic dysfunction. Patient has diuresed a net -5.7L since admission. Lasix transitioned from IV to PO yesterday, and Cr is now improving slightly. - lasix 80mg  PO bid pending further renal recs - Isosorbide ER 30 mg daily   - Strict I/O's  - Daily weights   Type 2 diabetes mellitus - CBGs well-controlled on SSI and lantus 30U qhs.   Hypertension - BP well-controlled on current regimen. - cont lasix  - cont metolazone  - cont norvasc  - cont imdur  - cont coreg  Anxiety/panic attacks - this appears to be a significant issue for this patient, although she does not take any psychiatric medications at home. Much improved with ativan and after initiation of home coreg (decreasing adrenergic activity).  - ativan 0.5mg  PO q6h (scheduled as pt has significant anxiety and appears not to be asking for or receiving PRN ativan)  - ativan PRN   DVT PPX - lovenox/coumadin  CODE STATUS - full  CONSULTS PLACED - Nephrology  The patient does have a current PCP (Dr. Neale Burly) and will not be requiring OPC follow-up after discharge.  The patient does not have transportation limitations that hinder transportation to clinic appointments.     Signed: Elfredia Nevins, MD  PGY-1, Internal Medicine Resident Pager: [phone] (7AM-5PM) 10/02/2012, 8:49 AM

## 2012-10-02 NOTE — Progress Notes (Addendum)
[facility] - PROGRESS NOTE Resident Note   Please see below for attending addendum to resident note.  Subjective:   Nausea and emesis yesterday again associated with dizziness   Objective:    Vital Signs:   Temp:  [98 F (36.7 C)-98.5 F (36.9 C)] 98.5 F (36.9 C) (03/12 0740) Pulse Rate:  [87-105] 88 (03/12 0740) Resp:  [18-20] 19 (03/12 0740) BP: (118-143)/(74-89) 122/74 mmHg (03/12 0740) SpO2:  [96 %-98 %] 96 % (03/12 0700) Weight:  [275 lb 5.7 oz (124.9 kg)] 275 lb 5.7 oz (124.9 kg) (03/12 0700) Last BM Date: 10/01/12  24-hour weight change: Weight change: -2 lb 10.3 oz (-1.2 kg)  Weight trends: Filed Weights   09/30/12 0504 10/01/12 0613 10/02/12 0700  Weight: 280 lb 13.9 oz (127.4 kg) 278 lb (126.1 kg) 275 lb 5.7 oz (124.9 kg)    Intake/Output:  03/11 0701 - 03/12 0700 In: 683 [P.O.:680; I.V.:3] Out: 2275 [Urine:2275]  Physical Exam: General: morbidly obese AA female, in no acute distress HEENT: Normocephalic, atraumatic, PERRLA, EOMI, No signs of anemia or jaundice, Moist mucous membranes, no purulence, normal turbinates, Oropharynx nonerythematous, no exudate appreciated, supple, no masses, no carotid Bruits, no JVD appreciated. Lungs: Normal respiratory effort. Clear to auscultation bilaterally from apices to bases without crackles or wheezes appreciated. Heart: normal rate, regular rhythm, normal S1 and S2, no gallop, murmur, or rubs appreciated. Abdomen: BS normoactive. Soft, Nondistended, non-tender. No masses or organomegaly appreciated. Extremities: trace LE edema, distal pulses intact, large callus to right heel Neurologic: grossly non-focal, A&O x3    Labs: Basic Metabolic Panel:  Recent Labs Lab 09/28/12 1457 09/28/12 1833  09/30/12 0508 10/01/12 0600 10/02/12 0505  NA 136  --   < > 136 138 139  K 3.8  --   < > 3.8 4.1 3.5  CL 106  --   < > 106 108 107  CO2 18*  --   < > 20 21 23   GLUCOSE 259*  --   < > 107* 126* 128*   BUN 23  --   < > 24* 31* 35*  CREATININE 1.77*  --   < > 2.12* 2.46* 2.38*  CALCIUM 8.5  --   < > 8.2* 8.3* 8.0*  MG  --  2.0  --   --   --   --   PHOS  --  3.7  --  4.8*  --   --   < > = values in this interval not displayed.  Liver Function Tests:  Recent Labs Lab 09/28/12 1457 09/30/12 0508  AST 21  --   ALT 19  --   ALKPHOS 100  --   BILITOT 0.1*  --   PROT 5.9*  --   ALBUMIN 1.4* 1.3*    CBC:  Recent Labs Lab 09/28/12 0719  09/29/12 0655 09/29/12 2007 09/30/12 0508  WBC 16.2*  --  11.4* 12.6* 9.6  NEUTROABS 12.8*  --  7.6  --   --   HGB 10.6*  < > 10.1* 10.4* 9.0*  HCT 30.8*  < > 29.9* 30.7* 27.2*  MCV 81.5  --  81.5 81.9 82.9  PLT 433*  --  463* 474* 466*  < > = values in this interval not displayed.  Cardiac Enzymes:  Recent Labs Lab 09/29/12 0014 09/29/12 0655 09/29/12 2007  TROPONINI <0.30 <0.30 <0.30    CBG:  Recent Labs Lab 10/01/12 0626 10/01/12 1126 10/01/12 1642 10/01/12 2114 10/02/12 0601  GLUCAP  107* 103* 143* 106* 118*    Urinalysis:    Component Value Date/Time   COLORURINE YELLOW 09/29/2012 1210   APPEARANCEUR TURBID* 09/29/2012 1210   LABSPEC 1.010 09/29/2012 1210   PHURINE 7.0 09/29/2012 1210   GLUCOSEU 100* 09/29/2012 1210   HGBUR LARGE* 09/29/2012 1210   BILIRUBINUR NEGATIVE 09/29/2012 1210   KETONESUR NEGATIVE 09/29/2012 1210   PROTEINUR >300* 09/29/2012 1210   UROBILINOGEN 0.2 09/29/2012 1210   NITRITE NEGATIVE 09/29/2012 1210   LEUKOCYTESUR NEGATIVE 09/29/2012 1210     Imaging: Dg Chest 2 View  10/01/2012  *RADIOLOGY REPORT*  Clinical Data: Fluid overload, history renal failure, hypertension, diabetes  CHEST - 2 VIEW  Comparison: 09/28/2012  Findings: Borderline enlargement of cardiac silhouette. Minimal pulmonary vascular congestion. Mediastinal contours normal. Lungs clear. No pleural effusion or pneumothorax. Bones unremarkable.  IMPRESSION: Borderline enlargement of cardiac silhouette with normal pulmonary vascular congestion. No  acute abnormalities.   Original Report Authenticated By: Ulyses Southward, M.D.       Medications:    Infusions:    Scheduled Medications: . amLODipine  10 mg Oral Daily  . carvedilol  25 mg Oral BID WC  . enoxaparin (LOVENOX) injection  1 mg/kg Subcutaneous Q12H  . furosemide  80 mg Oral BID  . insulin aspart  0-20 Units Subcutaneous TID WC  . insulin glargine  30 Units Subcutaneous QHS  . isosorbide mononitrate  30 mg Oral Daily  . LORazepam  0.5 mg Oral Q6H  . sodium chloride  3 mL Intravenous Q12H  . Warfarin - Pharmacist Dosing Inpatient   Does not apply q1800    PRN Medications: sodium chloride, acetaminophen, HYDROcodone-acetaminophen, levalbuterol, LORazepam, ondansetron, ondansetron, sodium chloride   Assessment/ Plan:    Pt is a 37 y.o. yo female with a PMHx of poorly controlled DM, Diabetic Nephropathy, likely diastolic CHF who was admitted on 09/28/2012 for shortness of breath. Renal service is consulted for nephrotic syndrome evaluation.  1. Diabetic Nephropathy Stage 4: creatinine 2.12 yesterday-->2.46 today, no IVF fluids currently, pt UOP 2.4L over past day with net -1.6L on Lasix 80 mg po bid Plan:  -cont hold ACEi  -oral lasix 80 mg bid with close serum creatinine monitoring  -monitor I/Os      Recent Labs Lab 09/28/12 1457 09/29/12 0655 09/30/12 0508 10/01/12 0600 10/02/12 0505  CREATININE 1.77* 1.91* 2.12* 2.46* 2.38*   2. Diastolic Congestive Heart Failure, exacerbation likely: on Lasix, metolazone held -cont carvedilol and amlodipine per primary team   3. Pulmonary Embolism: pt on Lovenox with Warfarin bridge -anticoag per pharmacy  4. Hypokalemia: resolved with supplementation, likely secondary to diuretic use  -cont to supplement potassium as needed   Recent Labs Lab 09/28/12 1457 09/29/12 0655 09/30/12 0508 10/01/12 0600 10/02/12 0505  K 3.8 3.4* 3.8 4.1 3.5    5. Diabetes Mellitus, uncontrolled: insulin dependent, previous HgbA1c  8.3% -management per primary team   CBG (last 3)   Recent Labs  10/01/12 1642 10/01/12 2114 10/02/12 0601  GLUCAP 143* 106* 118*    6. Hypertension: above goal with bp today on amlodipine, imdur, carvedilol and lasix -cont to hold lisinopril  7. Anxiety with Panic Attacks: currently on Ativan prn   Length of Stay: 4 days  Patient history and plan of care reviewed with attending, Dr. Allena Katz.   Manuela Schwartz, MD  PGYII, Internal Medicine Resident 10/02/2012, 9:00 AM  I have personally seen and examined this patient and agree with the assessment/plan as outlined above by Bosie Clos  MD (PGY2). Improving volume status on diuresis- renal function seems to approach a stable level. Would recommend DC on lasix 80mg  PO BID to f/u with her nephrologist in [place], [place]. Will sign off for now- please call with questions.  PATEL,JAY K.,MD 10/02/2012 10:19 AM

## 2012-10-02 NOTE — Progress Notes (Signed)
ANTICOAGULATION CONSULT NOTE - Follow Up Consult  Pharmacy Consult for Lovenox >> Coumadin Indication: pulmonary embolus (presumed)  No Known Allergies  Patient Measurements: Height: 5\' 8"  (172.7 cm) Weight: 275 lb 5.7 oz (124.9 kg) IBW/kg (Calculated) : 63.9  Vital Signs: Temp: 98.5 F (36.9 C) (03/12 0740) Temp src: Oral (03/12 0740) BP: 119/75 mmHg (03/12 0951) Pulse Rate: 88 (03/12 0740)  Labs:  Recent Labs  09/29/12 1856 09/29/12 2007 09/30/12 0508 10/01/12 0600 10/02/12 0505  HGB  --  10.4* 9.0*  --   --   HCT  --  30.7* 27.2*  --   --   PLT  --  474* 466*  --   --   LABPROT  --   --   --  13.9 14.4  INR  --   --   --  1.08 1.14  HEPARINUNFRC 0.27*  --  0.29*  --   --   CREATININE  --   --  2.12* 2.46* 2.38*  TROPONINI  --  <0.30  --   --   --     Estimated Creatinine Clearance: 45.6 ml/min (by C-G formula based on Cr of 2.38).   Medications:  Scheduled:  . amLODipine  10 mg Oral Daily  . carvedilol  25 mg Oral BID WC  . enoxaparin (LOVENOX) injection  1 mg/kg Subcutaneous Q12H  . enoxaparin   Does not apply Once  . furosemide  80 mg Oral BID  . insulin aspart  0-20 Units Subcutaneous TID WC  . insulin glargine  30 Units Subcutaneous QHS  . isosorbide mononitrate  30 mg Oral Daily  . LORazepam  0.5 mg Oral Q6H  . sodium chloride  3 mL Intravenous Q12H  . [COMPLETED] warfarin  10 mg Oral ONCE-1800  . Warfarin - Pharmacist Dosing Inpatient   Does not apply q1800  . [DISCONTINUED] furosemide  80 mg Intravenous BID  . [DISCONTINUED] furosemide  80 mg Oral BID   Infusions:     Assessment: 37 yo F admitted with SOB and chest pressure.  Pt was started on a heparin infusion for suspected PE.  Pt has been unable to complete studies to r/o PE due to ARF.  Pt was on heparin for ~ 48 hours and experienced nosebleeds and hematuria (possible related to menses).  Currently on Lovenox awaiting therapeutic INR with Coumadin.  Hgb is low but stable at 9.  PLTC  wnl.  INR has not moved much from baseline.  Will increase Coumadin dose.  Today is Day#3/5 minimum overlap for presumed PE.  Goal of Therapy:  INR 2-3 Monitor platelets by anticoagulation protocol: Yes   Plan:  1. Continue Lovenox 130mg  SQ Q12h 3. Coumadin 15mg  PO x 1 tonight. 4. Daily INR. 5. Coumadin book/video.    [employer], Pharm.D., BCPS Clinical Pharmacist Pager [phone] 10/02/2012 10:10 AM

## 2012-10-02 NOTE — Progress Notes (Signed)
Medical Student Daily Progress Note  Subjective: Today, Ms. Popoca continues to improve.  She denies any chest pain, pressure or SOB today.  She feels less anxious today.  Yesterday, she had an episode of dizziness and vomiting x1, but she states that was due to taking her pain medications on an empty stomach.  Objective: Vital signs in last 24 hours: Filed Vitals:   10/01/12 1902 10/01/12 2022 10/02/12 0700 10/02/12 0740  BP: 118/85 126/74 141/82 122/74  Pulse: 104 87 96 88  Temp:  98.1 F (36.7 C) 98 F (36.7 C) 98.5 F (36.9 C)  TempSrc:  Oral Oral Oral  Resp:  18 20 19   Height:      Weight:   124.9 kg (275 lb 5.7 oz)   SpO2:  98% 96%    Weight change: -1.2 kg (-2 lb 10.3 oz)  Intake/Output Summary (Last 24 hours) at 10/02/12 0852 Last data filed at 10/02/12 0700  Gross per 24 hour  Intake    440 ml  Output   2025 ml  Net  -1585 ml   Physical Exam: General: resting in bed comfortably in NAD HEENT: PERRL, EOMI, no scleral icterus Cardiac: RRR, no rubs, murmurs or gallops Pulm: improved breath sounds and easier work of breathing Abd: obese, soft, nontender, nondistended, BS present Ext: warm and well perfused, 1+ pretibial edema bilaterally Neuro: alert and oriented X3, cranial nerves II-XII grossly intact  Lab Results: CBC    Component Value Date/Time   WBC 9.6 09/30/2012 0508   RBC 3.28* 09/30/2012 0508   HGB 9.0* 09/30/2012 0508   HCT 27.2* 09/30/2012 0508   PLT 466* 09/30/2012 0508   MCV 82.9 09/30/2012 0508   MCH 27.4 09/30/2012 0508   MCHC 33.1 09/30/2012 0508   RDW 13.8 09/30/2012 0508   LYMPHSABS 1.8 09/29/2012 0655   MONOABS 1.0 09/29/2012 0655   EOSABS 1.0* 09/29/2012 0655   BASOSABS 0.0 09/29/2012 0655    CMP     Component Value Date/Time   NA 139 10/02/2012 0505   K 3.5 10/02/2012 0505   CL 107 10/02/2012 0505   CO2 23 10/02/2012 0505   GLUCOSE 128* 10/02/2012 0505   BUN 35* 10/02/2012 0505   CREATININE 2.38* 10/02/2012 0505   CALCIUM 8.0* 10/02/2012 0505   PROT  5.9* 09/28/2012 1457   ALBUMIN 1.3* 09/30/2012 0508   AST 21 09/28/2012 1457   ALT 19 09/28/2012 1457   ALKPHOS 100 09/28/2012 1457   BILITOT 0.1* 09/28/2012 1457   GFRNONAA 25* 10/02/2012 0505   GFRAA 29* 10/02/2012 0505   INR: 1.14   10/02/2012 0505  Studies/Results: Dg Chest 2 View  10/01/2012  *RADIOLOGY REPORT*  Clinical Data: Fluid overload, history renal failure, hypertension, diabetes  CHEST - 2 VIEW  Comparison: 09/28/2012  Findings: Borderline enlargement of cardiac silhouette. Minimal pulmonary vascular congestion. Mediastinal contours normal. Lungs clear. No pleural effusion or pneumothorax. Bones unremarkable.  IMPRESSION: Borderline enlargement of cardiac silhouette with normal pulmonary vascular congestion. No acute abnormalities.   Original Report Authenticated By: Ulyses Southward, M.D.    2D Echocardiogram with Contrast  09/29/2012 Study Conclusions - Left ventricle: The cavity size was normal. There was moderate concentric hypertrophy. Systolic function was normal. The estimated ejection fraction was in the range of 55% to 60%. Possible hypokinesis of the midinferoseptal myocardium. Doppler parameters are consistent with abnormal left ventricular relaxation (grade 1 diastolic dysfunction). - Right ventricle: The cavity size was mildly dilated. Wallthickness was normal. - Pericardium, extracardiac: A trivial  pericardial effusion was identified circumferential to the heart. There was no evidence of hemodynamic compromise.  Dg Chest 2 View 09/28/2012 *Radiology Report* Findings: 1. Extensive bibasilar air space disease concerning for sequelae of recent aspiration or developing bilateral lower lobe bronchopneumonia. 2. Probable small bilateral pleural effusions (left greater than right).   Medications: I have reviewed the patient's current medications. Scheduled Meds: . amLODipine  10 mg Oral Daily  . carvedilol  25 mg Oral BID WC  . enoxaparin (LOVENOX) injection  1 mg/kg Subcutaneous Q12H  .  furosemide  80 mg Oral BID  . insulin aspart  0-20 Units Subcutaneous TID WC  . insulin glargine  30 Units Subcutaneous QHS  . isosorbide mononitrate  30 mg Oral Daily  . LORazepam  0.5 mg Oral Q6H  . sodium chloride  3 mL Intravenous Q12H  . Warfarin - Pharmacist Dosing Inpatient   Does not apply q1800   Continuous Infusions:   PRN Meds:.sodium chloride, acetaminophen, HYDROcodone-acetaminophen, levalbuterol, LORazepam, ondansetron, ondansetron, sodium chloride  Assessment/Plan: Ms. Dix is a 37yo female with hx nephrotic syndrome, CHF, T2DM, and HTN that was admitted on 09/28/2012 for SOB and right sided chest pressure, now clinically presumed to be PE due to hypercoagulability from nephrotic syndrome. Over the past few days, Ms. Sampley has continued to improve and today she looks to be back to her baseline.  Principal Problems:  Shortness of breath  DDX: PE, panic attacks, CHF exacerbation, CAP, MI,  At this time, it is felt that her symptoms are mostly due to PE and nephrotic syndrome.  At first presentation, Ms. Meenach complained of relatively sudden onset of CP/SOB, tachycardia and elevated proBNP.  Her echo here showed EF of 60% with stage 1 diastolic dysfunction which is stable from echo in Nov 2013 and would be unlikely to cause such a severe presentation.  Additionally, her echo showed RV dilatation and inferior septal hypokinesis. Her EKG showed q waves in the inferior leads.  A RA ABG showed hypoxemia, hypocapnia and an Aa gradient of 40.  A CT chest could not be done due to elevated creatinine (2.38 today) and at time of presentation V/Q scan would not be reliable given acute bilateral venous congestion and possible bronchopneumonia.  Given that many of her symptoms have resolved, a V/Q scan now could be negative given anticoagulation for 3 days and would not rule out a PE four days ago at presentation.    Recent literature searches show that prophylactic anticoagulation in people with  nephrotic syndrome can be beneficial since they have a higher risk of emboli, and this risk is greatest in the first 21mo after diagnosis (citations below).  The patient and family wishes to pursue anticoagulation at least for the next 21mo and understands the risks and benefits discussed this morning.   - telemetry monitoring  - mag & phos returned WNL - plan to discharge today  PE workup:  -ABG shows pO2 72.0 and pCO2 29.5; which makes shunt or V/Q mismatch likely  -A-a gradient on 100% O2 showed inconclusive result  -continue coumadin & bridging lovenox  (INR 1.14 today) - per pharmacy rec's coumadin 15mg  today (3/12) and tomorrow (3/13) and then 10mg  qday -outpatient f/u with PCP scheduled for 3/16 with INR recheck -creatinine downtreading today to 2.38; CTA could cause more kidney damage currently  -V/Q scan might yield falsely negative result 4 days after admission with anticoagulation therapy. - LE venous duplex preliminary report showed no DVT  CHF workup:  -  cardiac echo from Nov 2013 showed EF 60-65% at OSH; echo here showed 60% EF with stage 1 diastolic fxn. Stable heart function. - troponins neg x3  - switch to PO lasix 80 BID (home dose 40mg  oral BID per patient) -continue home isosorbide mononitrate -continue home amlodipine and carvedilol - strict I/O  -daily weights  -levalbuterol nebulizer q6hrs PRN for SOB   CAP workup: pt afebrile and WBC trending down; SOB more likely due to fluid overload from PE & Nephrotic Syndrome. -azithromycin and ceftriaxone d/c on 3/10   Active Problems:  Acute on chronic renal failure/Nephrotic Syndrome: creatinine downtrending 2.38 today; 2.46 yesterday; 1.7 on admission; 1.45 at OSH (Nov 2013), 0.9 prior to Nov 2013 admission. OSH records indicate nephrotic syndrome due to diabetes.   - renal consulted. Appreciate their rec's. -U/A showed proteinuria, large Hbg (pt just started menses), glucose and few bacteria - potassium supplement  - switch to PO lasix (80mg  PO) 2x/day - 24 hr urine protein collection showed 14644 mg/day  - hold lisinopril and metolazone due to elevated creatinine  - obtained outpatient nephrologist records on 3/12. Will scan into chart.  Diabetes mellitus, type 2: per OSH records A1c >13.7 in Nov 2013  - Al1 here 8.3 - SSI + 30 units lantus qHS   Hypertension: BP elevated on admission (150/103-171/99).  BP returning to normal (122/74-141/82 today) -continue home amlodipine and carvedilol   Headache: denies h/a today   - stat head CT yesterday returned negative for ICH  - norco/vicodin PRN q6 hrs for pain  Anemia: per patient required a blood transfusion in Nov 2013; currently on iron supplement at home  - CBC showed stable hemoglobin --> no acute bleeding  Anxiety with Panic Attacks:  -Ativan scheduled q4hrs -Ativan PRN -social work consult to help with mental health resources in [facility]   Mahmoodi BK1, [address] [place], [place], [place], [place], Michell Heinrich [address] [employer]. High absolute risks and predictors of venous and arterial thromboembolic events in patients with nephrotic syndrome: results from a large retrospective cohort study. Circulation. 2008 Jan 15;117(2):224-30. Epub 2007 Dec 24.  Pincus KJ1, Hynicka LM. Prophylaxis of thromboembolic events in patients with nephrotic syndrome.  Ann Pharmacother. 2013 May;47(5):725-34. doi: 10.1345/aph.1R530. Epub 2013 Apr 23.  Medjeral-Thomas N1, Ziaj Dollene Primrose, Galliford J, Levy J, Cairns T, Griffith M.  Retrospective analysis of a novel regimen for the prevention of venous thromboembolism in nephrotic syndrome. Clin J Am Soc Nephrol. 2014 Mar;9(3):478-83. doi: 10.2215/CJN.07190713. Epub 2013 Dec 12.     LOS: 4 days   This is a Psychologist, occupational Note.  The care of the patient was discussed with Dr. Lavena Bullion and the assessment and plan formulated with their assistance.  Please see their attached note for official documentation  of the daily encounter.  Esau Grew 10/02/2012, 8:52 AM

## 2012-10-02 NOTE — Progress Notes (Signed)
DC IV, DC Tele, DC Home. Discharge instructions and home medications discussed with patient and patient's family. Patient and family denied any questions or concerns at this time. Patient leaving unit via wheelchair and appears in no acute distress.   

## 2012-10-02 NOTE — Discharge Summary (Signed)
Patient Name:  Janice Young MRN: [id number]  PCP: No primary provider on file. DOB:  1975/08/22       Date of Admission:  09/28/2012  Date of Discharge:  10/02/2012      Attending Physician: Dr. Dalphine Handing         DISCHARGE DIAGNOSES: Acute respiratory failure presumed 2/2 pulmonary embolism  Acute on chronic renal failure Nephrotic syndrome Grade 1 diastolic dysfunction Type 2 diabetes mellitus Hypertension Anxiety  DISPOSITION AND FOLLOW-UP: Janice Young is to follow-up with the listed providers as detailed below, at which time, the following should be addressed:   1. F/u on compliance with anticoagulation regimen (lovenox/warfarin bridge). Pt will need to be followed in a coumadin clinic (has first appt with coumadin clinic on 3/17). 2. Assess for ongoing symptoms of chest pain and SOB. Further evaluate this issue as indicated.  3. F/u on patient's renal function. 4. Schedule nephrology f/u. 5. Labs / imaging needed: BMP 6. Pending labs/ test needing follow-up: N/A  Follow-up Information   Follow up with LIM,SHERI, DO. (You have an appointment on 10/07/2012 at 11:15AM with Dr. Jaynie Collins.)    Contact information:   [address] [place], [place] [address] [phone]         Discharge Orders   Future Orders Complete By Expires     Diet - low sodium heart healthy  As directed     Increase activity slowly  As directed         DISCHARGE MEDICATIONS:   Medication List    STOP taking these medications       lisinopril 10 MG tablet  Commonly known as:  PRINIVIL,ZESTRIL     metolazone 2.5 MG tablet  Commonly known as:  ZAROXOLYN      TAKE these medications       amLODipine 10 MG tablet  Commonly known as:  NORVASC  Take 1 tablet (10 mg total) by mouth daily.     carvedilol 25 MG tablet  Commonly known as:  COREG  Take 25 mg by mouth 2 (two) times daily with a meal.     enoxaparin 150 MG/ML injection  Commonly known as:  LOVENOX  Inject 0.85 mLs  (130 mg total) into the skin every 12 (twelve) hours.     furosemide 80 MG tablet  Commonly known as:  LASIX  Take 1 tablet (80 mg total) by mouth 2 (two) times daily.     HYDROcodone-acetaminophen 5-325 MG per tablet  Commonly known as:  NORCO/VICODIN  Take 1-2 tablets by mouth every 6 (six) hours as needed.     insulin lispro protamine-insulin lispro (75-25) 100 UNIT/ML Susp  Commonly known as:  HUMALOG 75/25  Inject 30 Units into the skin 2 (two) times daily with a meal.     isosorbide mononitrate 30 MG 24 hr tablet  Commonly known as:  IMDUR  Take 30 mg by mouth daily.     LORazepam 0.5 MG tablet  Commonly known as:  ATIVAN  Take 1 tablet (0.5 mg total) by mouth every 6 (six) hours as needed for anxiety.     ondansetron 4 MG tablet  Commonly known as:  ZOFRAN  Take 1 tablet (4 mg total) by mouth every 8 (eight) hours as needed for nausea.     pravastatin 10 MG tablet  Commonly known as:  PRAVACHOL  Take 10 mg by mouth daily.     Vitamin D (Ergocalciferol) 50000 UNITS Caps  Commonly known  as:  DRISDOL  Take 50,000 Units by mouth every 7 (seven) days. Takes on Wednesday     warfarin 5 MG tablet  Commonly known as:  COUMADIN  Take 3 tablets (15mg ) each evening for the next two days (3/12-3/13/2014). Take 2 tablets (10mg ) every evening starting on 10/04/2012.         CONSULTS:  Nephrology  PROCEDURES PERFORMED:  Dg Chest 2 View 10/01/2012 IMPRESSION: Borderline enlargement of cardiac silhouette with normal pulmonary vascular congestion. No acute abnormalities.   Original Report Authenticated By: Ulyses Southward, M.D.    Dg Chest 2 View 09/28/2012 IMPRESSION: 1.  Extensive bibasilar air space disease concerning for sequelae of recent aspiration or developing bilateral lower lobe bronchopneumonia. 2.  Probable small bilateral pleural effusions (left greater than right).   Original Report Authenticated By: Trudie Reed, M.D.    Ct Head Wo Contrast 09/29/2012 IMPRESSION:  Negative   Original Report Authenticated By: Janeece Riggers, M.D.       ADMISSION DATA: H&P: Patient is a 37 y.o. female with a PMHx of CHF, CKD, T2DM, HTN, who presents to [facility] for evaluation of shortness of breath and chest "pressure" (denies any actual pain). The patient states that she's been having orthopnea for the last 5 days, and that for the last 2 days she's been having worsened shortness of breath also with exertion and at rest sitting upright, although she states it is not as bad as she when she is lying down. She states that for the last 2 days she's also been having right-sided chest pressure that radiates to her right arm.  The patient has a history of CHF since November 2013 and at home is on Lasix 40 mg twice a day, lisinopril, Coreg, and metolazone, all of which she's been taking regularly since that time until approximately one week ago when she was out of all her medications. She tries to weigh herself regularly at home, and admits to a 8 pound weight gain over the last 3 days(280 pounds to 288 pounds). She also admits to increased bilateral lower extremity swelling for approximately one week. She admits to a cough productive of clear, frothy sputum. She denies hemoptysis. She denies any recent fever or chills. She denies any recent sick contacts, or any recent or remote history of pneumonia.  Physical Exam: General: Vital signs reviewed and noted. Severe obesity.  Head: Normocephalic, atraumatic.  Eyes: PERRL, EOMI, No signs of anemia or jaundince.  Neck: No deformities, masses, or tenderness noted.Supple, No carotid Bruits, no JVD.  Lungs: Moderately increased WOB. Decreased breath sounds bilaterally.  Heart: RRR. S1 and S2 normal without gallop, murmur, or rubs.  Abdomen: BS normoactive. Soft, Nondistended, non-tender. No masses or organomegaly.  Extremities: 2+ pitting pretibial edema bilaterally.  Neurologic: A&O X3, CN II - XII are grossly intact. Motor strength is 5/5 in the  all 4 extremities, Sensations intact to light touch, Cerebellar signs negative.  Skin: No visible rashes, scars.   Labs: Comprehensive Metabolic Panel:    Component  Value  Date/Time    NA  139  09/28/2012 0735    K  4.2  09/28/2012 0735    CL  112  09/28/2012 0735    BUN  25*  09/28/2012 0735    CREATININE  1.70*  09/28/2012 0735    GLUCOSE  287*  09/28/2012 0735    CBC:    Component  Value  Date/Time    WBC  16.2*  09/28/2012 0719    HGB  9.5*  09/28/2012 0735    HCT  28.0*  09/28/2012 0735    PLT  433*  09/28/2012 0719    MCV  81.5  09/28/2012 0719    NEUTROABS  12.8*  09/28/2012 0719    LYMPHSABS  1.3  09/28/2012 0719    MONOABS  1.2*  09/28/2012 0719    EOSABS  0.9*  09/28/2012 0719    BASOSABS  0.1  09/28/2012 0719    Troponin (Point of Care Test)   Recent Labs   09/28/12 0734   TROPIPOC  0.04     HOSPITAL COURSE: Acute respiratory failure presumed 2/2 pulmonary embolism - the patient presented with relatively sudden onset of right-sided chest pain accompanied by shortness of breath. CXR on admission was revealing of nonspecific bibasilar airspace disease with likely bilateral pleural effusions. ABG on RA showed hypoxemia, hypocapnia, and an Aa gradient of 40. EKG revealed q-waves in inferior leads suggestive of RV strain. Strong clinical suspicion was present for acute pulmonary embolism, particularly in the context of the patient's nephrotic syndrome. Unfortunately, CTA chest was unable to be pursued due to patient's decreased GFR, and V/q scan would have been non-diagnostic given the patient's underlying CXR abnormalities. She was started empirically on IV heparin for presumed PE. 2d echo revealed RV dilatation and inferior septal hypokinesis, which further supported the diagnosis. Patient's symptoms improved throughout the remainder of her course, and CP/SOB were resolved at discharge. Patient was transitioned to lovenox/warfarin during her course, and was discharged with instructions to take these  medications until she is seen in the coumadin clinic at her PCP's office on 3/17, at which time her INR can be measured (INR = 1.14 at discharge). Patient will need to be on anticoagulation for at least 6 months--will defer to PCP whether the patient will need lifelong anticoagulation as is often recommended in patients with nephrotic syndrome with suspected VTE (see "nephrotic syndrome" below).  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 Cr on admission = 1.7, which trended upwards with diuresis for nephrotic syndrome/CHF, with peak Cr = 2.46 on 3/11. Pt was on IV lasix for the majority of her course, with doses as high as 80mg  bid. She was transitioned from IV to PO lasix prior to discharge, and her creatinine had improved from 2.46 => 2.38 as of 3/12. Patient will need nephrology f/u scheduled when she is seen by her PCP's office on 3/17.   Nephrotic syndrome - patient had previously been diagnosed with nephrotic syndrome. 24hr urine protein confirmed diagnosis, with proteinuria of approximately 15g/24hr. Albumin = 1.3. In the context of the patient's severe obesity, she would likely be a suitable candidate for prophylactic anticoagulation given her advanced nephrotic syndrome, and as she has now had a presumed VTE, she will very likely need long-term anticoagulation given her risk of future DVT/PE is extremely high.   Grade 1 diastolic dysfunction - no significant CHF despite initial concern for CHF exacerbation on admission. Elevated proBNP (~6000) is better explained by pulmonary embolism, as this would seem out of proportion to the patient's grade 1 diastolic dysfunction, even in the context of her decreased GFR.  Type 2 diabetes mellitus - A1c = 8.3. Patient appears to be compliant with her insulin regimen, which indicates she will likely this regimen escalated in  order to improve glycemic control. No changes made at time of discharge--will defer to PCP.  Hypertension - Marked hypertension earlier in hospital course prior to diuresis for nephrotic syndrome-related fluid retention and restarting her home coreg (her other home anti-HTN meds were started on admission, as pt provided record of taking these). Patient's severe hypertension (with BP max ~ 200/120) suggests that the patient did not have a massive or hemodynamically-compromising PE, despite evidence of RV strain. Lisinopril and metolazone were held at discharge, per nephrology recommendations. Lasix was increased to 80mg  bid.   Anxiety - significant issues with anxiety during entire hospital course which likely worsened the patient's hypertension. Patient also developed a headache at one point during her course which seemed likely related to anxiety as well (due to complaints of a 10/10 unilateral headache and "worst headache in life", CT head was performed which was negative for hemorrhage). She was given ativan regularly during her course, which significantly improved anxiety and also correlated with significantly improved BP. She was discharged with a prescription for a small supply of ativan to be taken PRN. As her anxiety seemed mostly secondary her acute medical issues, no SSRI was started, however if this issue persists it may be warranted at time of hospital follow-up.    DISCHARGE DATA: Vital Signs: BP 133/83  Pulse 86  Temp(Src) 98.4 F (36.9 C) (Oral)  Resp 19  Ht 5\' 8"  (1.727 m)  Wt 275 lb 5.7 oz (124.9 kg)  BMI 41.88 kg/m2  SpO2 99%  LMP 09/08/2012  Labs: Results for orders placed during the hospital encounter of 09/28/12 (from the past 24 hour(s))  GLUCOSE, CAPILLARY     Status: Abnormal   Collection Time    10/01/12  4:42 PM      Result Value Range   Glucose-Capillary 143 (*) 70 - 99 mg/dL   Comment 1 Notify RN    GLUCOSE, CAPILLARY     Status: Abnormal   Collection Time     10/01/12  9:14 PM      Result Value Range   Glucose-Capillary 106 (*) 70 - 99 mg/dL  PROTIME-INR     Status: None   Collection Time    10/02/12  5:05 AM      Result Value Range   Prothrombin Time 14.4  11.6 - 15.2 seconds   INR 1.14  0.00 - 1.49  BASIC METABOLIC PANEL     Status: Abnormal   Collection Time    10/02/12  5:05 AM      Result Value Range   Sodium 139  135 - 145 mEq/L   Potassium 3.5  3.5 - 5.1 mEq/L   Chloride 107  96 - 112 mEq/L   CO2 23  19 - 32 mEq/L   Glucose, Bld 128 (*) 70 - 99 mg/dL   BUN 35 (*) 6 - 23 mg/dL   Creatinine, Ser 9.81 (*) 0.50 - 1.10 mg/dL   Calcium 8.0 (*) 8.4 - 10.5 mg/dL   GFR calc non Af Amer 25 (*) >90 mL/min   GFR calc Af Amer 29 (*) >90 mL/min  GLUCOSE, CAPILLARY     Status: Abnormal   Collection Time    10/02/12  6:01 AM      Result Value Range   Glucose-Capillary 118 (*) 70 - 99 mg/dL   Comment 1 Notify RN    GLUCOSE, CAPILLARY     Status: Abnormal   Collection Time    10/02/12 11:18 AM      Result Value Range   Glucose-Capillary 136 (*) 70 - 99 mg/dL  Time spent on discharge: 40 minutes  Services Ordered on Discharge: Y = Yes; Blank = No PT:   OT:   RN:   Equipment:   Other:     Signed: Elfredia Nevins, MD   PGY 1, Internal Medicine Resident 10/02/2012, 1:57 PM

## 2012-10-06 NOTE — Discharge Summary (Signed)
I agree with resident documentation and discharge. Please see DC summary by resident for details of care.

## 2012-10-06 NOTE — Progress Notes (Signed)
Agree with note by Leontine Locket, MS-III. Please see my separate note for further details and clarifications.

## 2012-10-07 LAB — CULTURE, BLOOD (ROUTINE X 2)
Culture: NO GROWTH
Culture: NO GROWTH

## 2014-11-18 ENCOUNTER — Emergency Department (HOSPITAL_BASED_OUTPATIENT_CLINIC_OR_DEPARTMENT_OTHER)
Admission: EM | Admit: 2014-11-18 | Discharge: 2014-11-18 | Disposition: A | Discharge status: Home / Self Care | Payer: Medicaid Other | Attending: Emergency Medicine | Admitting: Emergency Medicine

## 2014-11-18 ENCOUNTER — Emergency Department (HOSPITAL_BASED_OUTPATIENT_CLINIC_OR_DEPARTMENT_OTHER): Payer: Medicaid Other

## 2014-11-18 ENCOUNTER — Encounter (HOSPITAL_BASED_OUTPATIENT_CLINIC_OR_DEPARTMENT_OTHER): Payer: Self-pay | Admitting: *Deleted

## 2014-11-18 DIAGNOSIS — Y9389 Activity, other specified: Secondary | ICD-10-CM | POA: Diagnosis not present

## 2014-11-18 DIAGNOSIS — Z7901 Long term (current) use of anticoagulants: Secondary | ICD-10-CM | POA: Insufficient documentation

## 2014-11-18 DIAGNOSIS — S7002XA Contusion of left hip, initial encounter: Secondary | ICD-10-CM | POA: Diagnosis not present

## 2014-11-18 DIAGNOSIS — E119 Type 2 diabetes mellitus without complications: Secondary | ICD-10-CM | POA: Diagnosis not present

## 2014-11-18 DIAGNOSIS — S0083XA Contusion of other part of head, initial encounter: Secondary | ICD-10-CM | POA: Insufficient documentation

## 2014-11-18 DIAGNOSIS — Y998 Other external cause status: Secondary | ICD-10-CM | POA: Insufficient documentation

## 2014-11-18 DIAGNOSIS — Z87448 Personal history of other diseases of urinary system: Secondary | ICD-10-CM | POA: Insufficient documentation

## 2014-11-18 DIAGNOSIS — I10 Essential (primary) hypertension: Secondary | ICD-10-CM | POA: Insufficient documentation

## 2014-11-18 DIAGNOSIS — W19XXXA Unspecified fall, initial encounter: Secondary | ICD-10-CM

## 2014-11-18 DIAGNOSIS — Y92481 Parking lot as the place of occurrence of the external cause: Secondary | ICD-10-CM | POA: Insufficient documentation

## 2014-11-18 DIAGNOSIS — S139XXA Sprain of joints and ligaments of unspecified parts of neck, initial encounter: Secondary | ICD-10-CM | POA: Diagnosis not present

## 2014-11-18 DIAGNOSIS — Z862 Personal history of diseases of the blood and blood-forming organs and certain disorders involving the immune mechanism: Secondary | ICD-10-CM | POA: Insufficient documentation

## 2014-11-18 DIAGNOSIS — Z792 Long term (current) use of antibiotics: Secondary | ICD-10-CM | POA: Diagnosis not present

## 2014-11-18 DIAGNOSIS — Z79899 Other long term (current) drug therapy: Secondary | ICD-10-CM | POA: Insufficient documentation

## 2014-11-18 DIAGNOSIS — S79912A Unspecified injury of left hip, initial encounter: Secondary | ICD-10-CM | POA: Diagnosis present

## 2014-11-18 DIAGNOSIS — W01198A Fall on same level from slipping, tripping and stumbling with subsequent striking against other object, initial encounter: Secondary | ICD-10-CM | POA: Insufficient documentation

## 2014-11-18 HISTORY — DX: Unspecified convulsions: R56.9

## 2014-11-18 MED ORDER — OXYCODONE-ACETAMINOPHEN 5-325 MG PO TABS: 1.0000 | ORAL_TABLET | Freq: Four times a day (QID) | ORAL | Status: AC | PRN

## 2014-11-18 MED ORDER — OXYCODONE-ACETAMINOPHEN 5-325 MG PO TABS
1.0000 | ORAL_TABLET | Freq: Once | ORAL | Status: AC
  Administered 2014-11-18: 1 via ORAL
  Filled 2014-11-18: qty 1

## 2014-11-18 NOTE — ED Provider Notes (Addendum)
CSN: [id number]     Arrival date & time 11/18/14  1646 History   First MD Initiated Contact with Patient 11/18/14 1700     Chief Complaint  Patient presents with  . Fall     (Consider location/radiation/quality/duration/timing/severity/associated sxs/prior Treatment) Patient is a 39 y.o. female presenting with fall. The history is provided by the patient.  Fall This is a new (Patient had just finished dialysis and was in the parking lot when she tripped and fell when she was getting into the car causing her to land on her left hip and hit her head on the pavement) problem. The current episode started 3 to 5 hours ago. The problem occurs constantly. The problem has not changed since onset.Associated symptoms comments: Headache, left neck pain and left hip pain. No LOC, dizziness, syncope.. The symptoms are aggravated by standing (Bearing weight). The symptoms are relieved by rest. The treatment provided no relief.    Past Medical History  Diagnosis Date  . Renal failure     Stage 1  . Hypertension   . Diabetes mellitus type 2 with complications, uncontrolled 1999  . Anemia Nov 2013  . History of nuclear stress test 05/30/12    Subtle reversible defect in the anterior septum at the junction of the mid ventricle and apex without corresponding wall motion abnormality, EF 60%  . Nephrotic syndrome 05/2012    Advanced diabetic nephrosclerosis; bx at [facility]  . Seizures    Past Surgical History  Procedure Laterality Date  . Cesarean section      x2  . Tonsillectomy    . Tubal ligation     Family History  Problem Relation Age of Onset  . Epilepsy Father   . Transient ischemic attack Mother   . Cancer - Other Mother     brain  . Hypertension Brother    History  Substance Use Topics  . Smoking status: Never Smoker   . Smokeless tobacco: Never Used  . Alcohol Use: No   OB History    No data available     Review of Systems  Skin: Positive for rash.       Rash  over her skin due to elevated phosphorus levels  Neurological: Positive for seizures.       Patient has a seizure disorder for which she takes Keppra. She seizes frequently and had a seizure earlier today. Family states it was typical of her seizures without any complications or new injury.  All other systems reviewed and are negative.     Allergies  Review of patient's allergies indicates no known allergies.  Home Medications   Prior to Admission medications   Medication Sig Start Date End Date Taking? Authorizing Provider  amLODipine (NORVASC) 10 MG tablet Take 1 tablet (10 mg total) by mouth daily. 10/02/12   Emory Nani Skillern, MD  carvedilol (COREG) 25 MG tablet Take 25 mg by mouth 2 (two) times daily with a meal.    Historical Provider, MD  furosemide (LASIX) 80 MG tablet Take 1 tablet (80 mg total) by mouth 2 (two) times daily. 10/02/12   Emory Nani Skillern, MD  HYDROcodone-acetaminophen (NORCO/VICODIN) 5-325 MG per tablet Take 1-2 tablets by mouth every 6 (six) hours as needed. 10/02/12   Emory Nani Skillern, MD  insulin lispro protamine-insulin lispro (HUMALOG 75/25) (75-25) 100 UNIT/ML SUSP Inject 30 Units into the skin 2 (two) times daily with a meal.    Historical Provider, MD  isosorbide mononitrate (IMDUR) 30 MG  24 hr tablet Take 30 mg by mouth daily.    Historical Provider, MD  LORazepam (ATIVAN) 0.5 MG tablet Take 1 tablet (0.5 mg total) by mouth every 6 (six) hours as needed for anxiety. 10/02/12   Emory Nani Skillern, MD  ondansetron (ZOFRAN) 4 MG tablet Take 1 tablet (4 mg total) by mouth every 8 (eight) hours as needed for nausea. 10/02/12   Emory Nani Skillern, MD  pravastatin (PRAVACHOL) 10 MG tablet Take 10 mg by mouth daily.    Historical Provider, MD  Vitamin D, Ergocalciferol, (DRISDOL) 50000 UNITS CAPS Take 50,000 Units by mouth every 7 (seven) days. Takes on Wednesday    Historical Provider, MD  warfarin (COUMADIN) 5 MG tablet Take 3 tablets ( ) each evening for the next two days  (3/12-3/13/2014). Take 2 tablets ( ) every evening starting on 10/04/2012. 10/02/12   Elfredia Nevins, MD   BP 166/92 mmHg  Pulse 88  Temp(Src) 98.8 F (37.1 C) (Oral)  Resp 16  Ht  (1.727 m)  Wt 273 lb (123.832 kg)  BMI 41.52 kg/m2  SpO2 96%  LMP 11/18/2014 Physical Exam  Constitutional: She is oriented to person, place, and time. She appears well-developed and well-nourished. No distress.  HENT:  Head: Normocephalic. Head is with contusion.    Eyes: EOM are normal. Pupils are equal, round, and reactive to light.  Neck: Muscular tenderness present. No spinous process tenderness present.    Cardiovascular: Normal rate, regular rhythm, normal heart sounds and intact distal pulses.  Exam reveals no friction rub.   No murmur heard. Pulmonary/Chest: Effort normal and breath sounds normal. She has no wheezes. She has no rales.  Abdominal: Soft. Bowel sounds are normal. She exhibits no distension. There is no tenderness. There is no rebound and no guarding.  Musculoskeletal: She exhibits edema.       Left hip: She exhibits decreased range of motion, tenderness and bony tenderness. She exhibits no swelling and no deformity.       Left upper arm: She exhibits no tenderness and no bony tenderness.       Arms: 1+ edema in bilateral lower extremities  Neurological: She is alert and oriented to person, place, and time. No cranial nerve deficit.  Skin: Skin is warm and dry. Rash noted.  Papular flesh-colored nodules diffusely over the body  Psychiatric: She has a normal mood and affect. Her behavior is normal.  Nursing note and vitals reviewed.   ED Course  Procedures (including critical care time) Labs Review Labs Reviewed - No data to display  Imaging Review Ct Head Wo Contrast  11/18/2014   CLINICAL DATA:  Pt states that she fell while getting into car, pt states that she has neuropathy in her legs and feet, pt states that she hit the back of her head, pt c.o pain in posterior  head and neck pain, pt was not in c-collar during imaging exam  EXAM: CT HEAD WITHOUT CONTRAST  CT CERVICAL SPINE WITHOUT CONTRAST  TECHNIQUE: Multidetector CT imaging of the head and cervical spine was performed following the standard protocol without intravenous contrast. Multiplanar CT image reconstructions of the cervical spine were also generated.  COMPARISON:  04/19/2014  FINDINGS: CT HEAD FINDINGS  Ventricles normal in size and configuration. No parenchymal masses or mass effect. No evidence of an infarct. There are no extra-axial masses or abnormal fluid collections.  There is no intracranial hemorrhage.  No skull fracture. Visualized sinuses and mastoid air cells are clear.  CT  CERVICAL SPINE FINDINGS  No fracture. No spondylolisthesis. There are no degenerative changes. Soft tissues are unremarkable.  IMPRESSION: HEAD CT:  Normal.  CERVICAL CT:  Normal.   Electronically Signed   By: Amie Portlandavid  Ormond M.D.   On: 11/18/2014 18:11   Ct Cervical Spine Wo Contrast  11/18/2014   CLINICAL DATA:  Pt states that she fell while getting into car, pt states that she has neuropathy in her legs and feet, pt states that she hit the back of her head, pt c.o pain in posterior head and neck pain, pt was not in c-collar during imaging exam  EXAM: CT HEAD WITHOUT CONTRAST  CT CERVICAL SPINE WITHOUT CONTRAST  TECHNIQUE: Multidetector CT imaging of the head and cervical spine was performed following the standard protocol without intravenous contrast. Multiplanar CT image reconstructions of the cervical spine were also generated.  COMPARISON:  04/19/2014  FINDINGS: CT HEAD FINDINGS  Ventricles normal in size and configuration. No parenchymal masses or mass effect. No evidence of an infarct. There are no extra-axial masses or abnormal fluid collections.  There is no intracranial hemorrhage.  No skull fracture. Visualized sinuses and mastoid air cells are clear.  CT CERVICAL SPINE FINDINGS  No fracture. No spondylolisthesis. There  are no degenerative changes. Soft tissues are unremarkable.  IMPRESSION: HEAD CT:  Normal.  CERVICAL CT:  Normal.   Electronically Signed   By: Amie Portlandavid  Ormond M.D.   On: 11/18/2014 18:11   Dg Hip Unilat With Pelvis 2-3 Views Left  11/18/2014   CLINICAL DATA:  39 year old female with a history of fall. Left hip pain  EXAM: LEFT HIP (WITH PELVIS) 2-3 VIEWS  COMPARISON:  None.  FINDINGS: Bony pelvic ring appears intact. No acute bony abnormality. Bilateral hips projects normally over the acetabula.  Osteoarthritis changes of the bilateral hips.  Visualized aspects of the bilateral femurs unremarkable.  Left hip appears congruent.  Vascular calcifications.  IMPRESSION: Negative for acute bony abnormality.  Atherosclerosis  Signed,  Yvone NeuJaime S. Loreta AveWagner, DO  [facility]  [facility]   Electronically Signed   By: Gilmer MorJaime  Wagner D.O.   On: 11/18/2014 18:24     EKG Interpretation None      MDM   Final diagnoses:  Fall  Contusion of left hip, initial encounter  Neck sprain and strain, initial encounter    Patient with a significant history for end-stage renal disease on dialysis, diabetes, hypertension, seizure disorder on Keppra presents today after a mechanical fall when she was getting into the car after dialysis. She fell in the parking lot landing on her left hip and hitting her head on the ground. Patient is no longer taking Coumadin and has been off anticoagulation is in heparin with dialysis for greater than 6 months. [facility] and after waiting 3 hours came here for further evaluation.  Patient has posterior head and left-sided neck pain. Also significant pain to the left hip with lateral compression and range of motion. Patient was only able to put minimal weight on left hip. Low suspicion for fracture as patient was able to bear weight most likely bruised.  Imaging pending  6:37 PM Imaging neg.  Treated for  contusion and d/cedhome.  Gwyneth SproutWhitney Tinsley Everman, MD 11/18/14 [id number]  Gwyneth SproutWhitney Shatira Dobosz, MD 11/18/14 [id number]

## 2014-11-18 NOTE — ED Notes (Signed)
Pt c/o fall out of car, c/o left hip and head pain

## 2014-11-18 NOTE — ED Notes (Signed)
Pt on period at this time. Denies pregnancy.  MD notified, patient ok'd for xray without pregnancy test.

## 2015-02-17 ENCOUNTER — Emergency Department (HOSPITAL_BASED_OUTPATIENT_CLINIC_OR_DEPARTMENT_OTHER)
Admission: EM | Admit: 2015-02-17 | Discharge: 2015-02-17 | Disposition: A | Discharge status: Home / Self Care | Payer: Medicaid Other | Attending: Emergency Medicine | Admitting: Emergency Medicine

## 2015-02-17 ENCOUNTER — Encounter (HOSPITAL_BASED_OUTPATIENT_CLINIC_OR_DEPARTMENT_OTHER): Payer: Self-pay

## 2015-02-17 DIAGNOSIS — Z862 Personal history of diseases of the blood and blood-forming organs and certain disorders involving the immune mechanism: Secondary | ICD-10-CM | POA: Diagnosis not present

## 2015-02-17 DIAGNOSIS — R21 Rash and other nonspecific skin eruption: Secondary | ICD-10-CM | POA: Insufficient documentation

## 2015-02-17 DIAGNOSIS — J029 Acute pharyngitis, unspecified: Secondary | ICD-10-CM | POA: Insufficient documentation

## 2015-02-17 DIAGNOSIS — I129 Hypertensive chronic kidney disease with stage 1 through stage 4 chronic kidney disease, or unspecified chronic kidney disease: Secondary | ICD-10-CM | POA: Insufficient documentation

## 2015-02-17 DIAGNOSIS — N181 Chronic kidney disease, stage 1: Secondary | ICD-10-CM | POA: Insufficient documentation

## 2015-02-17 DIAGNOSIS — Z794 Long term (current) use of insulin: Secondary | ICD-10-CM | POA: Diagnosis not present

## 2015-02-17 DIAGNOSIS — E119 Type 2 diabetes mellitus without complications: Secondary | ICD-10-CM | POA: Diagnosis not present

## 2015-02-17 DIAGNOSIS — Z79899 Other long term (current) drug therapy: Secondary | ICD-10-CM | POA: Insufficient documentation

## 2015-02-17 DIAGNOSIS — Z7901 Long term (current) use of anticoagulants: Secondary | ICD-10-CM | POA: Diagnosis not present

## 2015-02-17 LAB — RAPID STREP SCREEN (MED CTR MEBANE ONLY): STREPTOCOCCUS, GROUP A SCREEN (DIRECT): NEGATIVE

## 2015-02-17 NOTE — ED Notes (Signed)
Sore throat x 2 days

## 2015-02-17 NOTE — Discharge Instructions (Signed)

## 2015-02-17 NOTE — ED Provider Notes (Signed)
CSN: [id number]     Arrival date & time 02/17/15  1536 History   First MD Initiated Contact with Patient 02/17/15 1552     Chief Complaint  Patient presents with  . Sore Throat     The history is provided by the patient. No language interpreter was used.   Janice Young presents for evaluation of sore throat.  She reports two days of sore throat, worse at night.  She has slight associated cough.  No fevers, chest pain, vomiting, difficulty breathing, throat swelling.  She has chronic SOB and skin rash - unchanged from baseline.  She has a hx/o ESRD on HD m/w/f, missed dialysis today but scheduled for tomorrow.  She was with her grandson recently who has a cold.  Sxs are moderate, constant.    Past Medical History  Diagnosis Date  . Renal failure     Stage 1  . Hypertension   . Diabetes mellitus type 2 with complications, uncontrolled 1999  . Anemia Nov 2013  . History of nuclear stress test 05/30/12    Subtle reversible defect in the anterior septum at the junction of the mid ventricle and apex without corresponding wall motion abnormality, EF 60%  . Nephrotic syndrome 05/2012    Advanced diabetic nephrosclerosis; bx at [facility]  . Seizures    Past Surgical History  Procedure Laterality Date  . Cesarean section      x2  . Tonsillectomy    . Tubal ligation     Family History  Problem Relation Age of Onset  . Epilepsy Father   . Transient ischemic attack Mother   . Cancer - Other Mother     brain  . Hypertension Brother    History  Substance Use Topics  . Smoking status: Never Smoker   . Smokeless tobacco: Never Used  . Alcohol Use: No   OB History    No data available     Review of Systems  All other systems reviewed and are negative.     Allergies  Review of patient's allergies indicates no known allergies.  Home Medications   Prior to Admission medications   Medication Sig Start Date End Date Taking? Authorizing Provider  amLODipine (NORVASC) 10 MG  tablet Take 1 tablet (10 mg total) by mouth daily. 10/02/12   Emory Nani Skillern, MD  carvedilol (COREG) 25 MG tablet Take 25 mg by mouth 2 (two) times daily with a meal.    Historical Provider, MD  furosemide (LASIX) 80 MG tablet Take 1 tablet (80 mg total) by mouth 2 (two) times daily. 10/02/12   Emory Nani Skillern, MD  HYDROcodone-acetaminophen (NORCO/VICODIN) 5-325 MG per tablet Take 1-2 tablets by mouth every 6 (six) hours as needed. 10/02/12   Emory Nani Skillern, MD  insulin lispro protamine-insulin lispro (HUMALOG 75/25) (75-25) 100 UNIT/ML SUSP Inject 30 Units into the skin 2 (two) times daily with a meal.    Historical Provider, MD  isosorbide mononitrate (IMDUR) 30 MG 24 hr tablet Take 30 mg by mouth daily.    Historical Provider, MD  LORazepam (ATIVAN) 0.5 MG tablet Take 1 tablet (0.5 mg total) by mouth every 6 (six) hours as needed for anxiety. 10/02/12   Emory Nani Skillern, MD  ondansetron (ZOFRAN) 4 MG tablet Take 1 tablet (4 mg total) by mouth every 8 (eight) hours as needed for nausea. 10/02/12   Emory Nani Skillern, MD  oxyCODONE-acetaminophen (PERCOCET/ROXICET) 5-325 MG per tablet Take 1 tablet by mouth every 6 (six)  hours as needed for severe pain. 11/18/14   Gwyneth Sprout, MD  pravastatin (PRAVACHOL) 10 MG tablet Take 10 mg by mouth daily.    Historical Provider, MD  Vitamin D, Ergocalciferol, (DRISDOL) 50000 UNITS CAPS Take 50,000 Units by mouth every 7 (seven) days. Takes on Wednesday    Historical Provider, MD  warfarin (COUMADIN) 5 MG tablet Take 3 tablets (15mg ) each evening for the next two days (3/12-3/13/2014). Take 2 tablets (10mg ) every evening starting on 10/04/2012. 10/02/12   Elfredia Nevins, MD   BP 142/74 mmHg  Pulse 86  Temp(Src) 98.6 F (37 C) (Oral)  Resp 18  Ht 5\' 8"  (1.727 m)  Wt 273 lb (123.832 kg)  BMI 41.52 kg/m2  SpO2 94%  LMP 02/08/2015 Physical Exam  Constitutional: She is oriented to person, place, and time. She appears well-developed and well-nourished.  HENT:  Head:  Normocephalic and atraumatic.  Right Ear: External ear normal.  Left Ear: External ear normal.  Dry mouth.  Minimal erythema of posterior OP without edema.    Cardiovascular: Normal rate and regular rhythm.   No murmur heard. Pulmonary/Chest: Effort normal. No respiratory distress.  No stridor.  Decreased breath sounds in bilateral bases  Abdominal: Soft. There is no tenderness. There is no rebound and no guarding.  Musculoskeletal:  Fistula in LUE.    Neurological: She is alert and oriented to person, place, and time.  Skin: Skin is warm and dry.  Diffuse maculopapular rash  Psychiatric: She has a normal mood and affect. Her behavior is normal.  Nursing note and vitals reviewed.   ED Course  Procedures (including critical care time) Labs Review Labs Reviewed  RAPID STREP SCREEN (NOT AT [facility])  CULTURE, GROUP A STREP    Imaging Review No results found.   EKG Interpretation None      MDM   Final diagnoses:  Pharyngitis   Pt here for evaluation of sore throat.  Nontoxic appearing on examination.  Hx and presentation not c/w strep pharyngitis, epiglottitis, RPA, PTA.  Discussed home care for URI as well as close return precautions.  Discussed importance of close dialysis follow up.     Tilden Fossa, MD 02/17/15 [phone]

## 2015-02-19 LAB — CULTURE, GROUP A STREP

## 2015-03-25 DEATH — deceased

## 2015-09-01 IMAGING — CT CT HEAD W/O CM
4 of 5 series · 13 of 47 positions shown, 14 images · non-contrast
Comparison: 04/19/2014

CLINICAL DATA: Pt states that she fell while getting into car, pt
states that she has neuropathy in her legs and feet, pt states that
she hit the back of her head, pt c.o pain in posterior head and neck
pain, pt was not in c-collar during imaging exam

EXAM:
CT HEAD WITHOUT CONTRAST
CT CERVICAL SPINE WITHOUT CONTRAST
TECHNIQUE: Multidetector CT imaging of the head and cervical spine was
performed following the standard protocol without intravenous
contrast. Multiplanar CT image reconstructions of the cervical spine
were also generated.

[Series 2: head 4.8 h37s · axial · 0.45mm/px · z∈[-74,+8]mm · 3 of 32 slices shown, 4 images]
[im 8/32  brain]
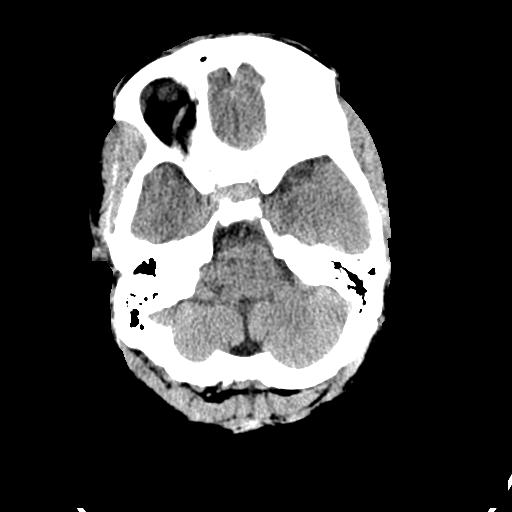
[im 8/32  bone]
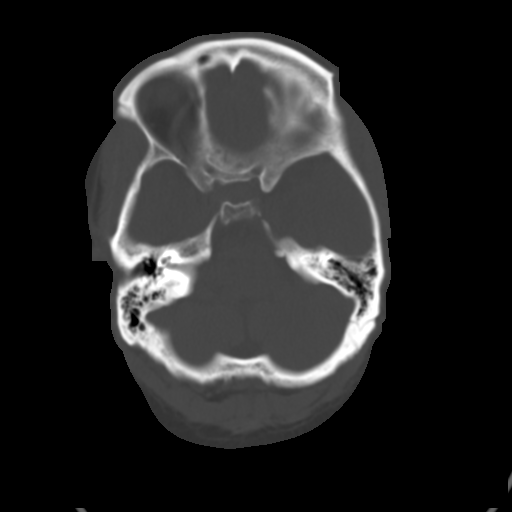
[im 16/32  brain]
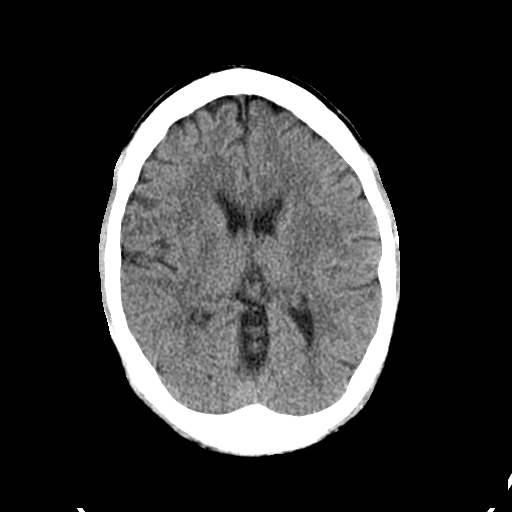
[im 24/32  brain]
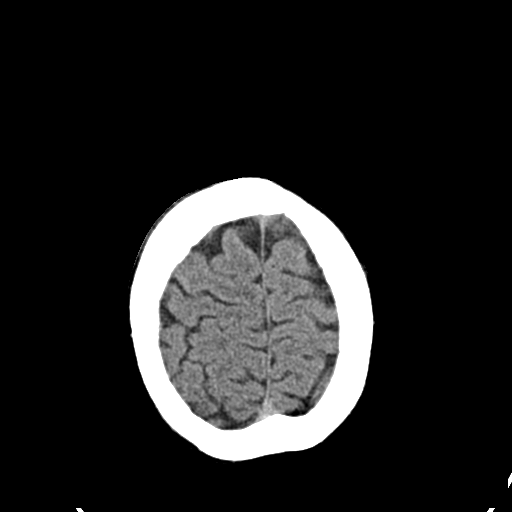

[Series 8: c_spine 2.0 coronal · coronal · 0.15mm/px · 3 of 34 slices shown]
[im 12/34  brain]
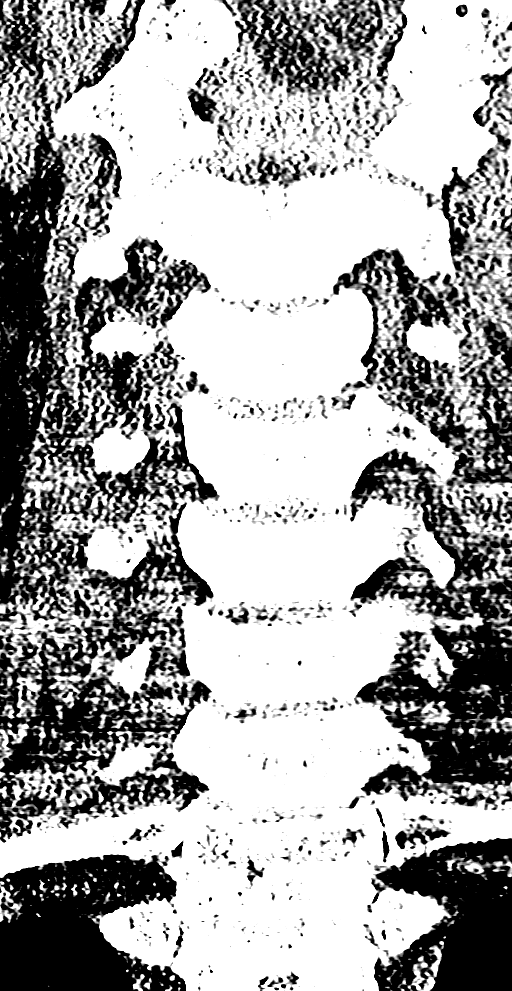
[im 15/34  brain]
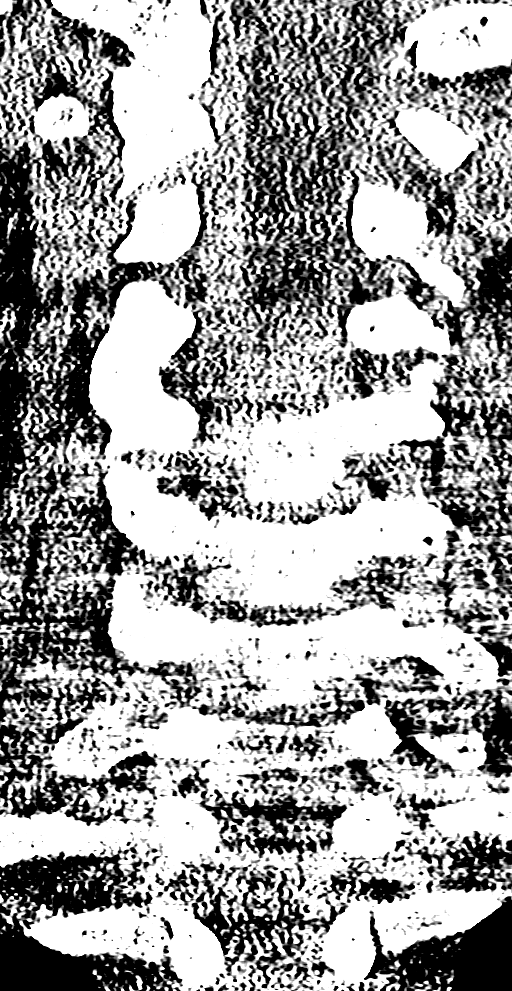
[im 19/34  brain]
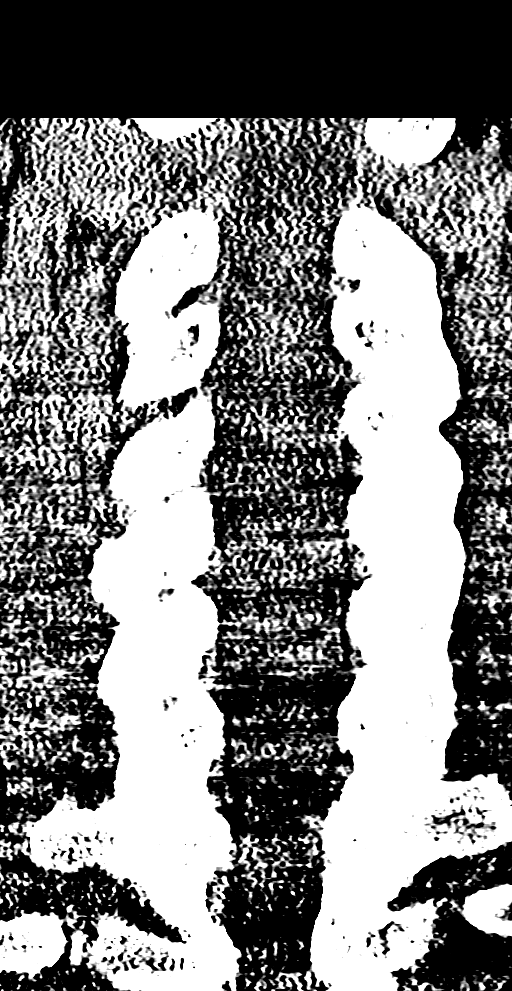

[Series 9: c_spine 2.0 sagittal · sagittal · 0.17mm/px · 3 of 39 slices shown]
[im 13/39  brain]
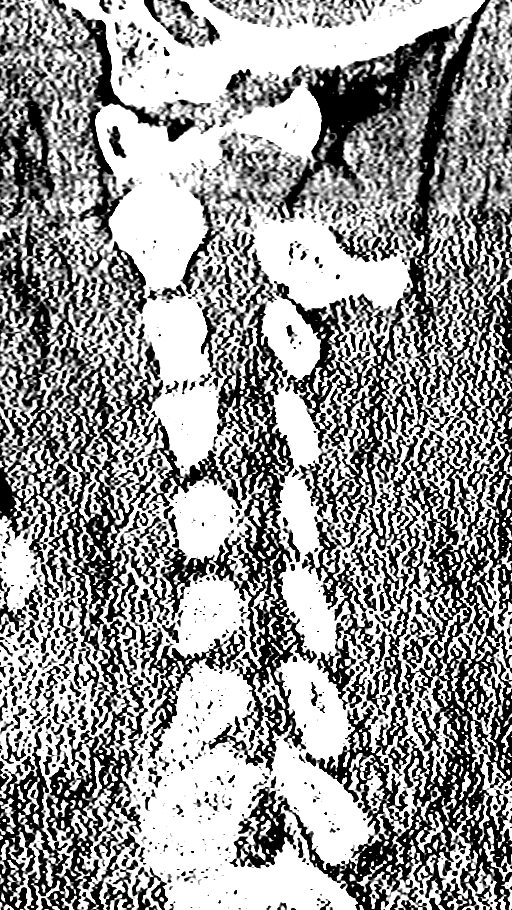
[im 20/39  brain]
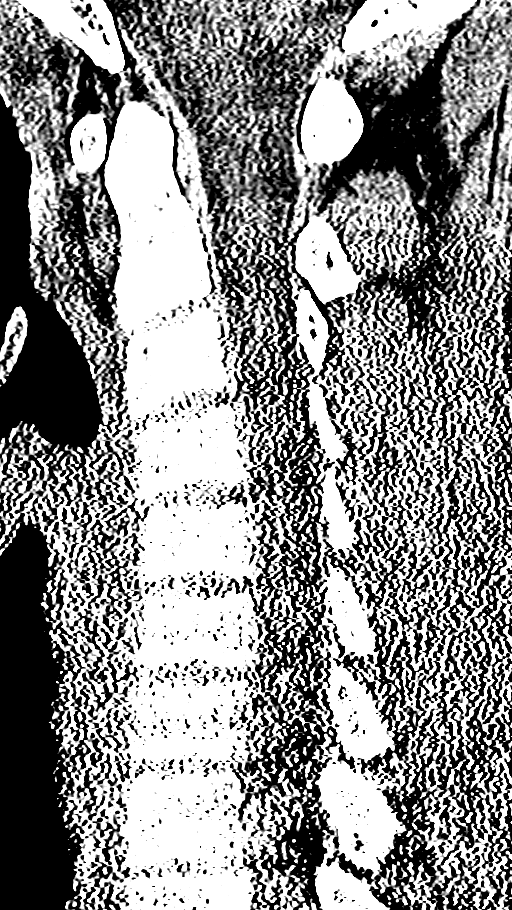
[im 26/39  brain]
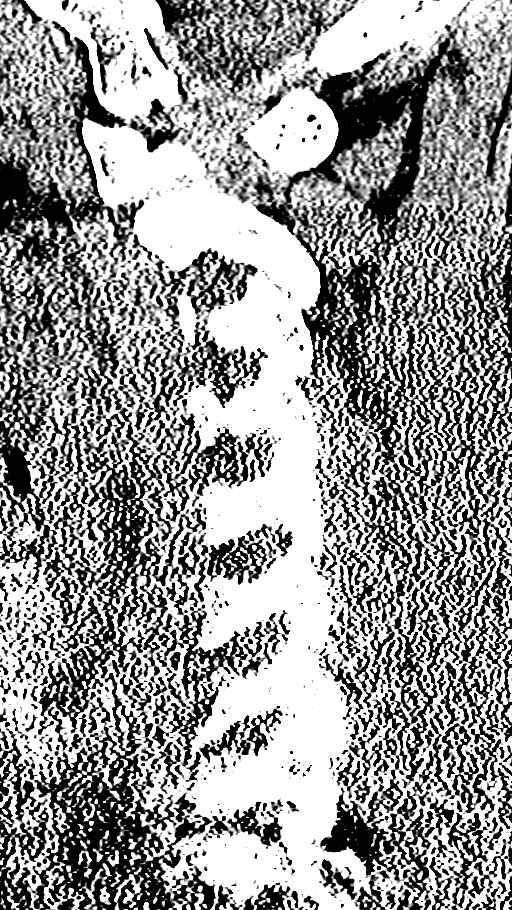

[Series 10: c_spine 2.0 orth ax · axial · 0.18mm/px · z∈[-280,-233]mm · 4 of 73 slices shown]
[im 7/73  brain]
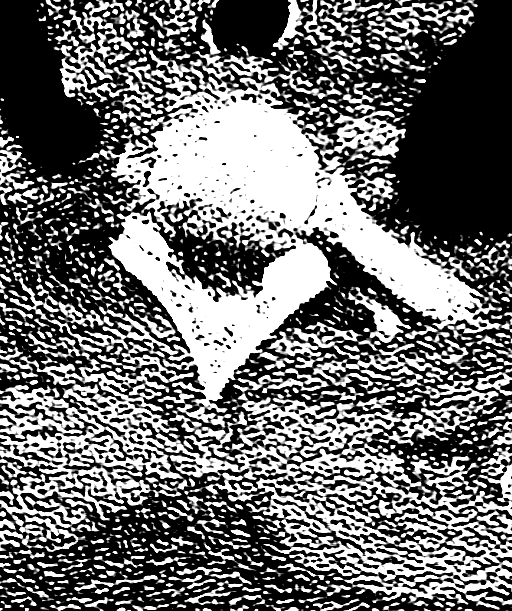
[im 19/73  brain]
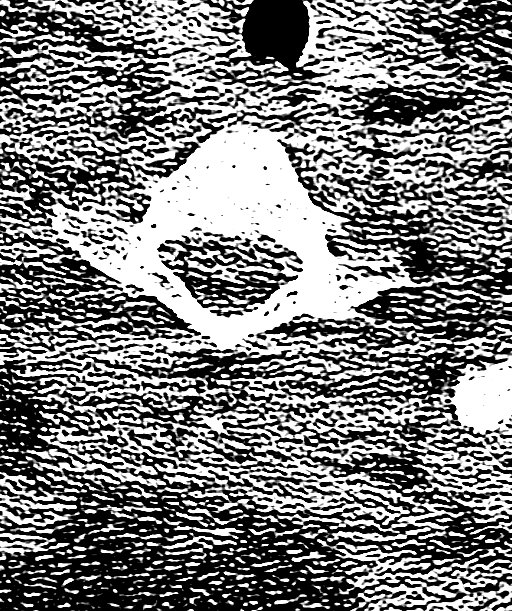
[im 25/73  brain]
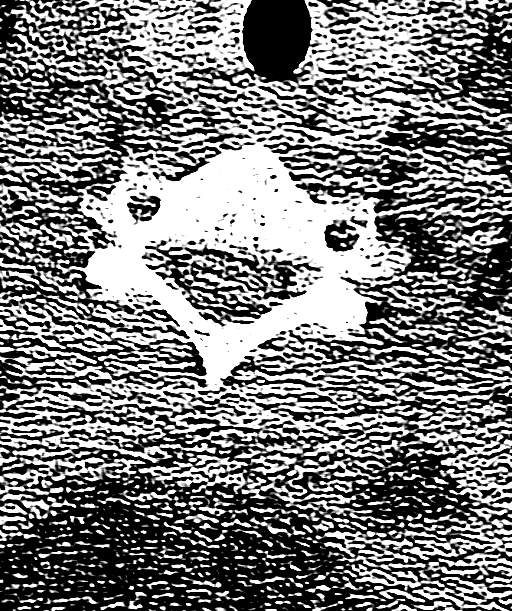
[im 31/73  brain]
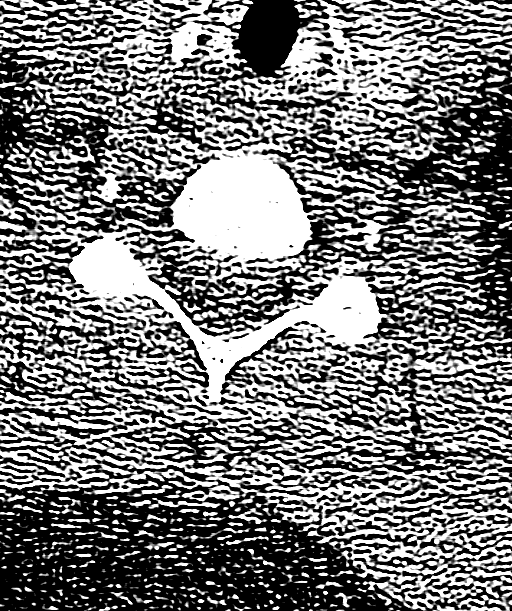

[13 of 47 positions shown; findings below may reference images not displayed]

FINDINGS: CT HEAD FINDINGS

Ventricles normal in size and configuration. No parenchymal masses
or mass effect. No evidence of an infarct. There are no extra-axial
masses or abnormal fluid collections.

There is no intracranial hemorrhage.

No skull fracture. Visualized sinuses and mastoid air cells are
clear.

CT CERVICAL SPINE FINDINGS

No fracture. No spondylolisthesis. There are no degenerative
changes. Soft tissues are unremarkable.
IMPRESSION: HEAD CT:  Normal.

CERVICAL CT:  Normal.
